# Patient Record
Sex: Male | Born: 1935 | Race: Black or African American | Hispanic: No | Marital: Single | Smoking: Never smoker
Health system: Southern US, Community
[De-identification: ages and names within clinical notes are randomized; demographics above are authoritative.]

## PROBLEM LIST (undated history)

## (undated) DIAGNOSIS — R160 Hepatomegaly, not elsewhere classified: Secondary | ICD-10-CM

## (undated) DIAGNOSIS — N400 Enlarged prostate without lower urinary tract symptoms: Secondary | ICD-10-CM

## (undated) DIAGNOSIS — I10 Essential (primary) hypertension: Secondary | ICD-10-CM

## (undated) DIAGNOSIS — I429 Cardiomyopathy, unspecified: Secondary | ICD-10-CM

## (undated) DIAGNOSIS — F0390 Unspecified dementia without behavioral disturbance: Secondary | ICD-10-CM

---

## 2005-09-15 ENCOUNTER — Emergency Department: Payer: Self-pay | Admitting: Emergency Medicine

## 2010-08-31 ENCOUNTER — Inpatient Hospital Stay: Payer: Self-pay | Admitting: Internal Medicine

## 2010-09-04 LAB — CEA: CEA: 1.3 ng/mL (ref 0.0–4.7)

## 2011-01-08 ENCOUNTER — Inpatient Hospital Stay: Payer: Self-pay | Admitting: Internal Medicine

## 2011-01-13 ENCOUNTER — Emergency Department: Payer: Self-pay | Admitting: Emergency Medicine

## 2011-01-13 LAB — PSA

## 2011-03-10 ENCOUNTER — Emergency Department (HOSPITAL_COMMUNITY)
Admission: EM | Admit: 2011-03-10 | Discharge: 2011-03-10 | Disposition: A | Discharge status: Home / Self Care | Payer: Medicare Other | Attending: Emergency Medicine | Admitting: Emergency Medicine

## 2011-03-10 DIAGNOSIS — R319 Hematuria, unspecified: Secondary | ICD-10-CM | POA: Insufficient documentation

## 2011-03-10 DIAGNOSIS — Z7982 Long term (current) use of aspirin: Secondary | ICD-10-CM | POA: Insufficient documentation

## 2011-03-10 DIAGNOSIS — R609 Edema, unspecified: Secondary | ICD-10-CM | POA: Insufficient documentation

## 2011-03-10 DIAGNOSIS — B351 Tinea unguium: Secondary | ICD-10-CM | POA: Insufficient documentation

## 2011-03-10 DIAGNOSIS — F0390 Unspecified dementia without behavioral disturbance: Secondary | ICD-10-CM | POA: Insufficient documentation

## 2011-03-10 DIAGNOSIS — L80 Vitiligo: Secondary | ICD-10-CM | POA: Insufficient documentation

## 2011-03-10 DIAGNOSIS — E538 Deficiency of other specified B group vitamins: Secondary | ICD-10-CM | POA: Insufficient documentation

## 2011-03-10 DIAGNOSIS — Z79899 Other long term (current) drug therapy: Secondary | ICD-10-CM | POA: Insufficient documentation

## 2011-03-10 DIAGNOSIS — I10 Essential (primary) hypertension: Secondary | ICD-10-CM | POA: Insufficient documentation

## 2011-03-10 LAB — URINALYSIS, ROUTINE W REFLEX MICROSCOPIC
Bilirubin Urine: NEGATIVE
Glucose, UA: NEGATIVE mg/dL
Protein, ur: >300 mg/dL — AB

## 2011-03-10 LAB — URINE MICROSCOPIC-ADD ON

## 2011-03-13 LAB — URINE CULTURE
Colony Count: >=100000
Culture  Setup Time: 201204110531

## 2014-10-27 ENCOUNTER — Encounter (HOSPITAL_COMMUNITY): Payer: Self-pay | Admitting: *Deleted

## 2014-10-27 ENCOUNTER — Inpatient Hospital Stay (HOSPITAL_COMMUNITY)
Admission: EM | Admit: 2014-10-27 | Discharge: 2014-10-31 | DRG: 683 | Disposition: A | Discharge status: Skilled Nursing Facility | Payer: Medicare Other | Attending: Internal Medicine | Admitting: Internal Medicine

## 2014-10-27 DIAGNOSIS — R4182 Altered mental status, unspecified: Secondary | ICD-10-CM

## 2014-10-27 DIAGNOSIS — N179 Acute kidney failure, unspecified: Principal | ICD-10-CM | POA: Diagnosis present

## 2014-10-27 DIAGNOSIS — I10 Essential (primary) hypertension: Secondary | ICD-10-CM | POA: Diagnosis present

## 2014-10-27 DIAGNOSIS — R0902 Hypoxemia: Secondary | ICD-10-CM | POA: Diagnosis present

## 2014-10-27 DIAGNOSIS — I252 Old myocardial infarction: Secondary | ICD-10-CM

## 2014-10-27 DIAGNOSIS — Z79899 Other long term (current) drug therapy: Secondary | ICD-10-CM

## 2014-10-27 DIAGNOSIS — E860 Dehydration: Secondary | ICD-10-CM | POA: Diagnosis present

## 2014-10-27 DIAGNOSIS — N400 Enlarged prostate without lower urinary tract symptoms: Secondary | ICD-10-CM

## 2014-10-27 DIAGNOSIS — F0390 Unspecified dementia without behavioral disturbance: Secondary | ICD-10-CM | POA: Diagnosis present

## 2014-10-27 DIAGNOSIS — I447 Left bundle-branch block, unspecified: Secondary | ICD-10-CM

## 2014-10-27 DIAGNOSIS — D649 Anemia, unspecified: Secondary | ICD-10-CM | POA: Diagnosis present

## 2014-10-27 DIAGNOSIS — E870 Hyperosmolality and hypernatremia: Secondary | ICD-10-CM

## 2014-10-27 DIAGNOSIS — R739 Hyperglycemia, unspecified: Secondary | ICD-10-CM

## 2014-10-27 DIAGNOSIS — Z7982 Long term (current) use of aspirin: Secondary | ICD-10-CM

## 2014-10-27 HISTORY — DX: Cardiomyopathy, unspecified: I42.9

## 2014-10-27 HISTORY — DX: Essential (primary) hypertension: I10

## 2014-10-27 HISTORY — DX: Benign prostatic hyperplasia without lower urinary tract symptoms: N40.0

## 2014-10-27 HISTORY — DX: Hepatomegaly, not elsewhere classified: R16.0

## 2014-10-27 HISTORY — DX: Unspecified dementia, unspecified severity, without behavioral disturbance, psychotic disturbance, mood disturbance, and anxiety: F03.90

## 2014-10-27 NOTE — ED Notes (Signed)
Pt to department via [facility] from [facility].  Per report, staff at facility states pt "just isn't himself".  EMS reports that when they arrived on scene, pt's heart rate was in the 40's and O2 saturations in 80's.  States that they thought pt was in a-fib when placed on cardiac monitor. Upon arrival to department  Pt showing sinus rhythm in 80's, O2 saturations 99-100 % on room air.  No distress noted at this time.

## 2014-10-27 NOTE — ED Provider Notes (Signed)
CSN: [id number]     Arrival date & time 10/27/14  2311 History   First MD Initiated Contact with Patient 10/27/14 2336     Chief Complaint  Patient presents with  . Weakness     (Consider location/radiation/quality/duration/timing/severity/associated sxs/prior Treatment) Patient is a 80 y.o. male presenting with weakness. The history is provided by the nursing home and the EMS personnel. The history is limited by the condition of the patient (Dementia).  Weakness  He was sent here from nursing home because he "wasn't himself". There is no further documentation regarding what was abnormal. EMS reports initial heart rate in the 40s and oxygen saturation in the 80s but both of those were normal on arrival here. Patient is verbal but does not answer questions in any coherent fashion.  Past Medical History  Diagnosis Date  . Hypertension   . Dementia   . Liver mass   . BPH (benign prostatic hyperplasia)   . Cardiomyopathy    History reviewed. No pertinent past surgical history. History reviewed. No pertinent family history. History  Substance Use Topics  . Smoking status: Unknown If Ever Smoked  . Smokeless tobacco: Not on file  . Alcohol Use: Not on file    Review of Systems  Unable to perform ROS: Dementia  Neurological: Positive for weakness.      Allergies  Review of patient's allergies indicates no known allergies.  Home Medications   Prior to Admission medications   Medication Sig Start Date End Date Taking? Authorizing Provider  amLODipine (NORVASC) 10 MG tablet Take 10 mg by mouth daily.   Yes Historical Provider, MD  aspirin 81 MG tablet Take 81 mg by mouth daily.   Yes Historical Provider, MD  ferrous sulfate 325 (65 FE) MG tablet Take 325 mg by mouth daily with breakfast.   Yes Historical Provider, MD  finasteride (PROSCAR) 5 MG tablet Take 5 mg by mouth daily.   Yes Historical Provider, MD  furosemide (LASIX) 40 MG tablet Take 40 mg by mouth 2 (two) times  daily.   Yes Historical Provider, MD  hydrALAZINE (APRESOLINE) 25 MG tablet Take 25 mg by mouth 3 (three) times daily.   Yes Historical Provider, MD   BP 118/62 mmHg  Pulse 87  Temp(Src) 97.7 F (36.5 C) (Oral)  Resp 16  SpO2 99% Physical Exam  Nursing note and vitals reviewed.  80 year old male, resting comfortably and in no acute distress. Vital signs are normal. Oxygen saturation is 99%, which is normal. Head is normocephalic and atraumatic. PERRLA, EOMI. Oropharynx is clear. Neck is nontender and supple without adenopathy or JVD. Back is nontender and there is no CVA tenderness. Lungs are clear without rales, wheezes, or rhonchi. Chest is nontender. Heart has regular rate and rhythm without murmur. Abdomen is soft, flat, nontender without masses or hepatosplenomegaly and peristalsis is normoactive. Extremities have no cyanosis or edema, full range of motion is present. Skin is warm and dry without rash. Neurologic: He is awake and alert but not oriented, cranial nerves are intact, there are no motor or sensory deficits.  ED Course  Procedures (including critical care time) Labs Review Results for orders placed or performed during the hospital encounter of 10/27/14  CBC with Differential  Result Value Ref Range   WBC 8.7 4.0 - 10.5 K/uL   RBC 4.02 (L) 4.22 - 5.81 MIL/uL   Hemoglobin 11.4 (L) 13.0 - 17.0 g/dL   HCT 82.9 (L) 56.2 - 13.0 %   MCV 92.8  78.0 - 100.0 fL   MCH 28.4 26.0 - 34.0 pg   MCHC 30.6 30.0 - 36.0 g/dL   RDW 08.614.0 57.811.5 - 46.915.5 %   Platelets 204 150 - 400 K/uL   Neutrophils Relative % 89 (H) 43 - 77 %   Neutro Abs 7.7 1.7 - 7.7 K/uL   Lymphocytes Relative 6 (L) 12 - 46 %   Lymphs Abs 0.5 (L) 0.7 - 4.0 K/uL   Monocytes Relative 5 3 - 12 %   Monocytes Absolute 0.4 0.1 - 1.0 K/uL   Eosinophils Relative 0 0 - 5 %   Eosinophils Absolute 0.0 0.0 - 0.7 K/uL   Basophils Relative 0 0 - 1 %   Basophils Absolute 0.0 0.0 - 0.1 K/uL  Comprehensive metabolic panel   Result Value Ref Range   Sodium 150 (H) 137 - 147 mEq/L   Potassium 4.6 3.7 - 5.3 mEq/L   Chloride 112 96 - 112 mEq/L   CO2 22 19 - 32 mEq/L   Glucose, Bld 231 (H) 70 - 99 mg/dL   BUN 629102 (H) 6 - 23 mg/dL   Creatinine, Ser 5.284.62 (H) 0.50 - 1.35 mg/dL   Calcium 9.6 8.4 - 41.310.5 mg/dL   Total Protein 8.1 6.0 - 8.3 g/dL   Albumin 3.1 (L) 3.5 - 5.2 g/dL   AST 9 0 - 37 U/L   ALT 10 0 - 53 U/L   Alkaline Phosphatase 87 39 - 117 U/L   Total Bilirubin 0.2 (L) 0.3 - 1.2 mg/dL   GFR calc non Af Amer 10 (L) >90 mL/min   GFR calc Af Amer 12 (L) >90 mL/min   Anion gap 16 (H) 5 - 15  Ammonia  Result Value Ref Range   Ammonia 26 11 - 60 umol/L   Imaging Review Ct Head Wo Contrast  10/28/2014   CLINICAL DATA:  Acute onset of altered mental status and generalized weakness. Initial encounter.  EXAM: CT HEAD WITHOUT CONTRAST  TECHNIQUE: Contiguous axial images were obtained from the base of the skull through the vertex without intravenous contrast.  COMPARISON:  None.  FINDINGS: There is no evidence of acute infarction, mass lesion, or intra- or extra-axial hemorrhage on CT.  Prominence of the ventricles and sulci reflects mild cortical volume loss. Scattered periventricular and subcortical white matter change likely reflects small vessel ischemic microangiopathy. Cerebellar atrophy is noted.  The brainstem and fourth ventricle are within normal limits. The basal ganglia are unremarkable in appearance. The cerebral hemispheres demonstrate grossly normal gray-white differentiation. No mass effect or midline shift is seen.  There is no evidence of fracture; visualized osseous structures are unremarkable in appearance. The visualized portions of the orbits are within normal limits. Chronic mucoperiosteal thickening is noted at the right maxillary sinus. The remaining paranasal sinuses and mastoid air cells are well-aerated. No significant soft tissue abnormalities are seen.  IMPRESSION: 1. No acute intracranial  pathology seen on CT. 2. Mild cortical volume loss and scattered small vessel ischemic microangiopathy. 3. Chronic mucoperiosteal thickening at the right maxillary sinus.   Electronically Signed   By: Roanna RaiderJeffery  Chang M.D.   On: 10/28/2014 01:03   Dg Chest Port 1 View  10/28/2014   CLINICAL DATA:  80 year old male with altered mental status and generalized weakness  EXAM: PORTABLE CHEST - 1 VIEW  COMPARISON:  CT scan of the head performed earlier today  FINDINGS: Mild cardiomegaly. Inspiratory volumes are low and there are bibasilar opacities. Normal mediastinal contours. No pneumothorax.  No acute osseous abnormality.  IMPRESSION: 1. Low inspiratory volumes with bibasilar opacities favored to reflect atelectasis. Superimposed infiltrate would be difficult to exclude radiographically. 2. Mild cardiomegaly.   Electronically Signed   By: Malachy MoanHeath  McCullough M.D.   On: 10/28/2014 01:24     EKG Interpretation   Date/Time:  Friday October 27 2014 23:24:21 EST Ventricular Rate:  86 PR Interval:  242 QRS Duration: 160 QT Interval:  424 QTC Calculation: 507 R Axis:   6 Text Interpretation:  Sinus rhythm Prolonged PR interval Left bundle  branch block No old tracing to compare Confirmed by [facility] IncGLICK  MD, Oneal Biglow  ([id number]) on 10/27/2014 11:36:22 PM      MDM   Final diagnoses:  Altered mental status  Acute renal failure, unspecified acute renal failure type  Hypernatremia  Normochromic normocytic anemia  Hyperglycemia    Altered mentation with report of episode of bradycardia and hypoxia. Nothing on physical exam to point me in any specific direction currently. General screening workup will be obtained for altered mental status and weakness with focus on evaluation for occult infection.  Workup is significant for hypernatremia and markedly elevated BUN and creatinine. BUN: Creatinine ratio is elevated suggesting dehydration. He will be given IV fluids and will need to be admitted to monitor renal  function. Case is discussed with Dr. Konrad DoloresMerrell of [facility] who agrees to admit the patient.  Dione Boozeavid Riordan Walle, MD 10/28/14 [phone]

## 2014-10-28 ENCOUNTER — Encounter (HOSPITAL_COMMUNITY): Payer: Self-pay | Admitting: General Practice

## 2014-10-28 ENCOUNTER — Emergency Department (HOSPITAL_COMMUNITY): Payer: Medicare Other

## 2014-10-28 DIAGNOSIS — F0390 Unspecified dementia without behavioral disturbance: Secondary | ICD-10-CM

## 2014-10-28 DIAGNOSIS — E870 Hyperosmolality and hypernatremia: Secondary | ICD-10-CM | POA: Diagnosis present

## 2014-10-28 DIAGNOSIS — Z79899 Other long term (current) drug therapy: Secondary | ICD-10-CM | POA: Diagnosis not present

## 2014-10-28 DIAGNOSIS — R0902 Hypoxemia: Secondary | ICD-10-CM | POA: Insufficient documentation

## 2014-10-28 DIAGNOSIS — I252 Old myocardial infarction: Secondary | ICD-10-CM | POA: Diagnosis not present

## 2014-10-28 DIAGNOSIS — Z7982 Long term (current) use of aspirin: Secondary | ICD-10-CM | POA: Diagnosis not present

## 2014-10-28 DIAGNOSIS — E860 Dehydration: Secondary | ICD-10-CM | POA: Diagnosis present

## 2014-10-28 DIAGNOSIS — N179 Acute kidney failure, unspecified: Secondary | ICD-10-CM | POA: Insufficient documentation

## 2014-10-28 DIAGNOSIS — I447 Left bundle-branch block, unspecified: Secondary | ICD-10-CM | POA: Diagnosis not present

## 2014-10-28 DIAGNOSIS — R4182 Altered mental status, unspecified: Secondary | ICD-10-CM | POA: Diagnosis present

## 2014-10-28 DIAGNOSIS — D649 Anemia, unspecified: Secondary | ICD-10-CM | POA: Diagnosis not present

## 2014-10-28 DIAGNOSIS — R001 Bradycardia, unspecified: Secondary | ICD-10-CM | POA: Insufficient documentation

## 2014-10-28 DIAGNOSIS — I10 Essential (primary) hypertension: Secondary | ICD-10-CM

## 2014-10-28 DIAGNOSIS — N400 Enlarged prostate without lower urinary tract symptoms: Secondary | ICD-10-CM

## 2014-10-28 LAB — CBC
HCT: 35.3 % — ABNORMAL LOW (ref 39.0–52.0)
Hemoglobin: 11 g/dL — ABNORMAL LOW (ref 13.0–17.0)
MCH: 28.7 pg (ref 26.0–34.0)
MCHC: 31.2 g/dL (ref 30.0–36.0)
MCV: 92.2 fL (ref 78.0–100.0)
PLATELETS: 216 10*3/uL (ref 150–400)
RBC: 3.83 MIL/uL — ABNORMAL LOW (ref 4.22–5.81)
RDW: 13.9 % (ref 11.5–15.5)
WBC: 10.8 10*3/uL — AB (ref 4.0–10.5)

## 2014-10-28 LAB — COMPREHENSIVE METABOLIC PANEL
ALK PHOS: 87 U/L (ref 39–117)
ALT: 10 U/L (ref 0–53)
ANION GAP: 16 — AB (ref 5–15)
AST: 9 U/L (ref 0–37)
Albumin: 3.1 g/dL — ABNORMAL LOW (ref 3.5–5.2)
BILIRUBIN TOTAL: 0.2 mg/dL — AB (ref 0.3–1.2)
BUN: 102 mg/dL — AB (ref 6–23)
CHLORIDE: 112 meq/L (ref 96–112)
CO2: 22 meq/L (ref 19–32)
Calcium: 9.6 mg/dL (ref 8.4–10.5)
Creatinine, Ser: 4.62 mg/dL — ABNORMAL HIGH (ref 0.50–1.35)
GFR calc non Af Amer: 10 mL/min — ABNORMAL LOW (ref >90)
GFR, EST AFRICAN AMERICAN: 12 mL/min — AB (ref >90)
GLUCOSE: 231 mg/dL — AB (ref 70–99)
POTASSIUM: 4.6 meq/L (ref 3.7–5.3)
SODIUM: 150 meq/L — AB (ref 137–147)
TOTAL PROTEIN: 8.1 g/dL (ref 6.0–8.3)

## 2014-10-28 LAB — MRSA PCR SCREENING: MRSA by PCR: NEGATIVE

## 2014-10-28 LAB — CBC WITH DIFFERENTIAL/PLATELET
Basophils Absolute: 0 10*3/uL (ref 0.0–0.1)
Basophils Relative: 0 % (ref 0–1)
Eosinophils Absolute: 0 10*3/uL (ref 0.0–0.7)
Eosinophils Relative: 0 % (ref 0–5)
HCT: 37.3 % — ABNORMAL LOW (ref 39.0–52.0)
HEMOGLOBIN: 11.4 g/dL — AB (ref 13.0–17.0)
LYMPHS ABS: 0.5 10*3/uL — AB (ref 0.7–4.0)
LYMPHS PCT: 6 % — AB (ref 12–46)
MCH: 28.4 pg (ref 26.0–34.0)
MCHC: 30.6 g/dL (ref 30.0–36.0)
MCV: 92.8 fL (ref 78.0–100.0)
MONOS PCT: 5 % (ref 3–12)
Monocytes Absolute: 0.4 10*3/uL (ref 0.1–1.0)
NEUTROS ABS: 7.7 10*3/uL (ref 1.7–7.7)
NEUTROS PCT: 89 % — AB (ref 43–77)
PLATELETS: 204 10*3/uL (ref 150–400)
RBC: 4.02 MIL/uL — AB (ref 4.22–5.81)
RDW: 14 % (ref 11.5–15.5)
WBC: 8.7 10*3/uL (ref 4.0–10.5)

## 2014-10-28 LAB — RAPID URINE DRUG SCREEN, HOSP PERFORMED
Amphetamines: NOT DETECTED
Barbiturates: NOT DETECTED
Benzodiazepines: NOT DETECTED
Cocaine: NOT DETECTED
Opiates: NOT DETECTED
Tetrahydrocannabinol: NOT DETECTED

## 2014-10-28 LAB — ETHANOL: Alcohol, Ethyl (B): <11 mg/dL (ref 0–11)

## 2014-10-28 LAB — BASIC METABOLIC PANEL
ANION GAP: 13 (ref 5–15)
ANION GAP: 16 — AB (ref 5–15)
BUN: 99 mg/dL — ABNORMAL HIGH (ref 6–23)
BUN: 90 mg/dL — ABNORMAL HIGH (ref 6–23)
CALCIUM: 9.3 mg/dL (ref 8.4–10.5)
CHLORIDE: 120 meq/L — AB (ref 96–112)
CHLORIDE: 112 meq/L (ref 96–112)
CO2: 22 mEq/L (ref 19–32)
CO2: 21 mEq/L (ref 19–32)
CREATININE: 3.55 mg/dL — AB (ref 0.50–1.35)
Calcium: 9.1 mg/dL (ref 8.4–10.5)
Creatinine, Ser: 4.22 mg/dL — ABNORMAL HIGH (ref 0.50–1.35)
GFR calc Af Amer: 17 mL/min — ABNORMAL LOW (ref >90)
GFR calc non Af Amer: 12 mL/min — ABNORMAL LOW (ref >90)
GFR calc non Af Amer: 14 mL/min — ABNORMAL LOW (ref >90)
GFR, EST AFRICAN AMERICAN: 13 mL/min — AB (ref >90)
GLUCOSE: 157 mg/dL — AB (ref 70–99)
Glucose, Bld: 115 mg/dL — ABNORMAL HIGH (ref 70–99)
POTASSIUM: 4.5 meq/L (ref 3.7–5.3)
Potassium: 4.3 mEq/L (ref 3.7–5.3)
Sodium: 155 mEq/L — ABNORMAL HIGH (ref 137–147)
Sodium: 149 mEq/L — ABNORMAL HIGH (ref 137–147)

## 2014-10-28 LAB — URINALYSIS, ROUTINE W REFLEX MICROSCOPIC
BILIRUBIN URINE: NEGATIVE
Glucose, UA: NEGATIVE mg/dL
HGB URINE DIPSTICK: NEGATIVE
KETONES UR: NEGATIVE mg/dL
Leukocytes, UA: NEGATIVE
NITRITE: NEGATIVE
Protein, ur: NEGATIVE mg/dL
SPECIFIC GRAVITY, URINE: 1.015 (ref 1.005–1.030)
UROBILINOGEN UA: 0.2 mg/dL (ref 0.0–1.0)
pH: 5.5 (ref 5.0–8.0)

## 2014-10-28 LAB — AMMONIA: Ammonia: 26 umol/L (ref 11–60)

## 2014-10-28 LAB — HEPARIN LEVEL (UNFRACTIONATED): Heparin Unfractionated: 0.55 IU/mL (ref 0.30–0.70)

## 2014-10-28 LAB — TROPONIN I
Troponin I: <0.3 ng/mL (ref <0.30)
Troponin I: <0.3 ng/mL (ref <0.30)

## 2014-10-28 LAB — PROTIME-INR
INR: 1.31 (ref 0.00–1.49)
Prothrombin Time: 16.4 seconds — ABNORMAL HIGH (ref 11.6–15.2)

## 2014-10-28 MED ORDER — ONDANSETRON HCL 4 MG/2ML IJ SOLN: 4.0000 mg | Freq: Four times a day (QID) | INTRAMUSCULAR | Status: DC | PRN

## 2014-10-28 MED ORDER — ACETAMINOPHEN 650 MG RE SUPP: 650.0000 mg | Freq: Four times a day (QID) | RECTAL | Status: DC | PRN

## 2014-10-28 MED ORDER — HEPARIN BOLUS VIA INFUSION
4000.0000 [IU] | Freq: Once | INTRAVENOUS | Status: AC
  Administered 2014-10-28: 4000 [IU] via INTRAVENOUS
  Filled 2014-10-28: qty 4000

## 2014-10-28 MED ORDER — ACETAMINOPHEN 325 MG PO TABS: 650.0000 mg | ORAL_TABLET | Freq: Four times a day (QID) | ORAL | Status: DC | PRN

## 2014-10-28 MED ORDER — HEPARIN (PORCINE) IN NACL 100-0.45 UNIT/ML-% IJ SOLN
1000.0000 [IU]/h | INTRAMUSCULAR | Status: DC
  Administered 2014-10-28 (×2): 1000 [IU]/h via INTRAVENOUS
  Filled 2014-10-28 (×2): qty 250

## 2014-10-28 MED ORDER — INFLUENZA VAC SPLIT QUAD 0.5 ML IM SUSY
0.5000 mL | PREFILLED_SYRINGE | INTRAMUSCULAR | Status: AC
  Administered 2014-10-29: 0.5 mL via INTRAMUSCULAR
  Filled 2014-10-28: qty 0.5

## 2014-10-28 MED ORDER — ONDANSETRON HCL 4 MG PO TABS: 4.0000 mg | ORAL_TABLET | Freq: Four times a day (QID) | ORAL | Status: DC | PRN

## 2014-10-28 MED ORDER — SODIUM CHLORIDE 0.9 % IV SOLN: INTRAVENOUS | Status: DC

## 2014-10-28 MED ORDER — ASPIRIN 81 MG PO CHEW
81.0000 mg | CHEWABLE_TABLET | Freq: Every day | ORAL | Status: DC
  Administered 2014-10-28 – 2014-10-31 (×4): 81 mg via ORAL
  Filled 2014-10-28 (×4): qty 1

## 2014-10-28 MED ORDER — SODIUM CHLORIDE 0.9 % IV SOLN: 1000.0000 mL | INTRAVENOUS | Status: DC

## 2014-10-28 MED ORDER — HYDRALAZINE HCL 25 MG PO TABS
25.0000 mg | ORAL_TABLET | Freq: Three times a day (TID) | ORAL | Status: DC
  Administered 2014-10-28 – 2014-10-31 (×11): 25 mg via ORAL
  Filled 2014-10-28 (×11): qty 1

## 2014-10-28 MED ORDER — PNEUMOCOCCAL VAC POLYVALENT 25 MCG/0.5ML IJ INJ
0.5000 mL | INJECTION | INTRAMUSCULAR | Status: AC
  Administered 2014-10-29: 0.5 mL via INTRAMUSCULAR
  Filled 2014-10-28: qty 0.5

## 2014-10-28 MED ORDER — OXYCODONE HCL 5 MG PO TABS: 5.0000 mg | ORAL_TABLET | ORAL | Status: DC | PRN

## 2014-10-28 MED ORDER — SODIUM CHLORIDE 0.9 % IV SOLN
1000.0000 mL | Freq: Once | INTRAVENOUS | Status: DC
Start: 2014-10-28 — End: 2014-10-31

## 2014-10-28 MED ORDER — HEPARIN SODIUM (PORCINE) 5000 UNIT/ML IJ SOLN: 5000.0000 [IU] | Freq: Three times a day (TID) | INTRAMUSCULAR | Status: DC

## 2014-10-28 MED ORDER — FUROSEMIDE 40 MG PO TABS
40.0000 mg | ORAL_TABLET | Freq: Two times a day (BID) | ORAL | Status: DC
  Administered 2014-10-28: 40 mg via ORAL
  Filled 2014-10-28: qty 1

## 2014-10-28 MED ORDER — SODIUM CHLORIDE 0.45 % IV SOLN
INTRAVENOUS | Status: DC
  Administered 2014-10-28 – 2014-10-31 (×8): via INTRAVENOUS

## 2014-10-28 MED ORDER — AMLODIPINE BESYLATE 5 MG PO TABS
10.0000 mg | ORAL_TABLET | Freq: Every day | ORAL | Status: DC
  Administered 2014-10-28 – 2014-10-31 (×4): 10 mg via ORAL
  Filled 2014-10-28 (×4): qty 2

## 2014-10-28 MED ORDER — FINASTERIDE 5 MG PO TABS
5.0000 mg | ORAL_TABLET | Freq: Every day | ORAL | Status: DC
  Administered 2014-10-28 – 2014-10-31 (×4): 5 mg via ORAL
  Filled 2014-10-28 (×5): qty 1

## 2014-10-28 NOTE — Progress Notes (Signed)
[facility] PROGRESS NOTE  Daniel LinkWilliam Francois MVH:[id number] DOB: May 12, 1926 DOA: 10/27/2014 PCP: No primary care provider on file.  Assessment/Plan: 1. LBBB 1. Unknown if acute or chronic 2. Pt without obvious chest pains, no diaphoresis or nausea 3. Pt does have hx of prior MI, so this finding may be chronic. No old EKG to compare to. 4. Will check 2d echo for WMA 5. Repeat ekg in AM for changes 6. Complete serial enzymes 7. Consider holding further heparin gtt 2. ARF 1. Cont IVF hydration 2. Hold lasix 3. Renal function slightly improved 4. Pt reportedly voiding briskly - unable to measure secondary to incontinence 3. Hypernatremia 1. Rising this AM 2. Hold lasix 3. Suspect secondary to dehydration 4. HTN 1. BP stable  2. Cont current regimen 5. BPH 1. Cont proscar 6. DVT prophylaxis 1. Cont on heparin subq  Code Status: Full Family Communication: Pt in room (indicate person spoken with, relationship, and if by phone, the number) Disposition Plan: Pending   Consultants:    Procedures:    Antibiotics:   (indicate start date, and stop date if known)  HPI/Subjective: No acute events noted. Pt remains confused  Objective: Filed Vitals:   10/28/14 0644 10/28/14 0844 10/28/14 1134 10/28/14 1555  BP: 144/52 128/55 139/72 139/67  Pulse: 82 86 95 92  Temp: 98.2 F (36.8 C)   98.5 F (36.9 C)  TempSrc: Oral   Oral  Resp: 18   20  Height:      Weight:      SpO2: 100% 99% 100% 100%    Intake/Output Summary (Last 24 hours) at 10/28/14 1643 Last data filed at 10/28/14 1514  Gross per 24 hour  Intake  617.5 ml  Output    500 ml  Net  117.5 ml   Filed Weights   10/28/14 0256  Weight: 88.27 kg (194 lb 9.6 oz)    Exam:   General:  Awake, in nad  Cardiovascular: regular, s1, s2  Respiratory: normal resp effort, no wheezing  Abdomen: soft,nondistended  Musculoskeletal: perfused, no clubbing   Data Reviewed: Basic Metabolic Panel:  Recent  Labs Lab 10/28/14 0014 10/28/14 0623  NA 150* 155*  K 4.6 4.5  CL 112 120*  CO2 22 22  GLUCOSE 231* 115*  BUN 102* 99*  CREATININE 4.62* 4.22*  CALCIUM 9.6 9.1   Liver Function Tests:  Recent Labs Lab 10/28/14 0014  AST 9  ALT 10  ALKPHOS 87  BILITOT 0.2*  PROT 8.1  ALBUMIN 3.1*   No results for input(s): LIPASE, AMYLASE in the last 168 hours.  Recent Labs Lab 10/28/14 0014  AMMONIA 26   CBC:  Recent Labs Lab 10/28/14 0014 10/28/14 0621  WBC 8.7 10.8*  NEUTROABS 7.7  --   HGB 11.4* 11.0*  HCT 37.3* 35.3*  MCV 92.8 92.2  PLT 204 216   Cardiac Enzymes:  Recent Labs Lab 10/28/14 1112 10/28/14 1310  TROPONINI <0.30 <0.30   BNP (last 3 results) No results for input(s): PROBNP in the last 8760 hours. CBG: No results for input(s): GLUCAP in the last 168 hours.  Recent Results (from the past 240 hour(s))  MRSA PCR Screening     Status: None   Collection Time: 10/28/14  3:40 AM  Result Value Ref Range Status   MRSA by PCR NEGATIVE NEGATIVE Final    Comment:        The GeneXpert MRSA Assay (FDA approved for NASAL specimens only), is one component of a comprehensive MRSA  colonization surveillance program. It is not intended to diagnose MRSA infection nor to guide or monitor treatment for MRSA infections.      Studies: Ct Head Wo Contrast  10/28/2014   CLINICAL DATA:  Acute onset of altered mental status and generalized weakness. Initial encounter.  EXAM: CT HEAD WITHOUT CONTRAST  TECHNIQUE: Contiguous axial images were obtained from the base of the skull through the vertex without intravenous contrast.  COMPARISON:  None.  FINDINGS: There is no evidence of acute infarction, mass lesion, or intra- or extra-axial hemorrhage on CT.  Prominence of the ventricles and sulci reflects mild cortical volume loss. Scattered periventricular and subcortical white matter change likely reflects small vessel ischemic microangiopathy. Cerebellar atrophy is noted.   The brainstem and fourth ventricle are within normal limits. The basal ganglia are unremarkable in appearance. The cerebral hemispheres demonstrate grossly normal gray-white differentiation. No mass effect or midline shift is seen.  There is no evidence of fracture; visualized osseous structures are unremarkable in appearance. The visualized portions of the orbits are within normal limits. Chronic mucoperiosteal thickening is noted at the right maxillary sinus. The remaining paranasal sinuses and mastoid air cells are well-aerated. No significant soft tissue abnormalities are seen.  IMPRESSION: 1. No acute intracranial pathology seen on CT. 2. Mild cortical volume loss and scattered small vessel ischemic microangiopathy. 3. Chronic mucoperiosteal thickening at the right maxillary sinus.   Electronically Signed   By: Roanna RaiderJeffery  Chang M.D.   On: 10/28/2014 01:03   Dg Chest Port 1 View  10/28/2014   CLINICAL DATA:  80 year old male with altered mental status and generalized weakness  EXAM: PORTABLE CHEST - 1 VIEW  COMPARISON:  CT scan of the head performed earlier today  FINDINGS: Mild cardiomegaly. Inspiratory volumes are low and there are bibasilar opacities. Normal mediastinal contours. No pneumothorax. No acute osseous abnormality.  IMPRESSION: 1. Low inspiratory volumes with bibasilar opacities favored to reflect atelectasis. Superimposed infiltrate would be difficult to exclude radiographically. 2. Mild cardiomegaly.   Electronically Signed   By: Malachy MoanHeath  McCullough M.D.   On: 10/28/2014 01:24    Scheduled Meds: . sodium chloride  1,000 mL Intravenous Once  . amLODipine  10 mg Oral Daily  . aspirin  81 mg Oral Daily  . finasteride  5 mg Oral Daily  . hydrALAZINE  25 mg Oral TID  . [START ON 10/29/2014] Influenza vac split quadrivalent PF  0.5 mL Intramuscular Tomorrow-1000  . [START ON 10/29/2014] pneumococcal 23 valent vaccine  0.5 mL Intramuscular Tomorrow-1000   Continuous Infusions: . sodium  chloride 100 mL/hr at 10/28/14 1341  . heparin 1,000 Units/hr (10/28/14 0455)    Principal Problem:   Altered mental status Active Problems:   Acute kidney injury   Dementia   Hypernatremia   BPH (benign prostatic hyperplasia)   Essential hypertension   History of myocardial infarction   Left bundle branch block   Normochromic normocytic anemia    Time spent: 30min    Hutch Rhett K  [facility] Pager [phone]. If 7PM-7AM, please contact night-coverage at [email or web address], password [email or web address] 10/28/2014, 4:43 PM  LOS: 1 day

## 2014-10-28 NOTE — Progress Notes (Signed)
ANTICOAGULATION CONSULT NOTE -  Pharmacy Consult for Heparin Indication: chest pain/ACS  No Known Allergies  Patient Measurements: Height: 5\' 11"  (180.3 cm) Weight: 194 lb 9.6 oz (88.27 kg) IBW/kg (Calculated) : 75.3 Heparin Dosing Weight: 82 kg  Vital Signs: Temp: 98.2 F (36.8 C) (11/28 0644) Temp Source: Oral (11/28 0644) BP: 139/72 mmHg (11/28 1134) Pulse Rate: 95 (11/28 1134)  Labs:  Recent Labs  10/28/14 0014 10/28/14 0621 10/28/14 0623 10/28/14 1112 10/28/14 1310  HGB 11.4* 11.0*  --   --   --   HCT 37.3* 35.3*  --   --   --   PLT 204 216  --   --   --   LABPROT  --  16.4*  --   --   --   INR  --  1.31  --   --   --   HEPARINUNFRC  --   --   --   --  0.55  CREATININE 4.62*  --  4.22*  --   --   TROPONINI  --   --   --  <0.30 <0.30    Estimated Creatinine Clearance: 13.4 mL/min (by C-G formula based on Cr of 4.22).   Medical History: Past Medical History  Diagnosis Date  . Hypertension   . Dementia   . Liver mass   . BPH (benign prostatic hyperplasia)   . Cardiomyopathy     Medications:  Scheduled:  . sodium chloride  1,000 mL Intravenous Once  . amLODipine  10 mg Oral Daily  . aspirin  81 mg Oral Daily  . finasteride  5 mg Oral Daily  . furosemide  40 mg Oral BID  . hydrALAZINE  25 mg Oral TID  . [START ON 10/29/2014] Influenza vac split quadrivalent PF  0.5 mL Intramuscular Tomorrow-1000  . [START ON 10/29/2014] pneumococcal 23 valent vaccine  0.5 mL Intramuscular Tomorrow-1000    Assessment: Left bundle block, history of MI Labs reviewed PTA medication reviewed Heparin level therapeutic  Goal of Therapy:  Heparin level 0.3-0.7 units/ml Monitor platelets by anticoagulation protocol: Yes   Plan:  Continue Heparin 1000 units/hr rate Heparin level and CBC daily   Daniel Holt 10/28/2014,1:45 PM

## 2014-10-28 NOTE — H&P (Signed)
[facility] History and Physical  Daniel LinkWilliam Holt ZOX:[id number] DOB: July 08, 1926 DOA: 10/27/2014  Referring physician: Dr Preston FleetingGlick - [facility] PCP: No primary care provider on file.   Chief Complaint: AMS  HPI: Daniel Holt is a 80 y.o. male  Pt altered and no family present for admission. Report given by Dr. Preston FleetingGlick in the ED. Pts caretaker at the nursing home not available for consultation at time of admission  Per report, pt presenting from NH as not being "himself." Started today. Per EMS, initial HR 40s and O2 sats 80s.    Review of Systems:  Per HPI   Past Medical History  Diagnosis Date  . Hypertension   . Dementia   . Liver mass   . BPH (benign prostatic hyperplasia)   . Cardiomyopathy    History reviewed. No pertinent past surgical history. Social History:  has no tobacco, alcohol, and drug history on file.  No Known Allergies  History reviewed. No pertinent family history.   Prior to Admission medications   Medication Sig Start Date End Date Taking? Authorizing Provider  amLODipine (NORVASC) 10 MG tablet Take 10 mg by mouth daily.   Yes Historical Provider, MD  aspirin 81 MG tablet Take 81 mg by mouth daily.   Yes Historical Provider, MD  ferrous sulfate 325 (65 FE) MG tablet Take 325 mg by mouth daily with breakfast.   Yes Historical Provider, MD  finasteride (PROSCAR) 5 MG tablet Take 5 mg by mouth daily.   Yes Historical Provider, MD  furosemide (LASIX) 40 MG tablet Take 40 mg by mouth 2 (two) times daily.   Yes Historical Provider, MD  hydrALAZINE (APRESOLINE) 25 MG tablet Take 25 mg by mouth 3 (three) times daily.   Yes Historical Provider, MD   Physical Exam: Filed Vitals:   10/27/14 2320 10/27/14 2330 10/28/14 0211 10/28/14 0256  BP: 118/62 107/55 141/65 114/64  Pulse: 87 84 74 90  Temp: 97.7 F (36.5 C)  98.2 F (36.8 C) 98.6 F (37 C)  TempSrc: Oral  Oral Oral  Resp: 16 17 18 18   Weight:    88.27 kg (194 lb 9.6 oz)  SpO2: 99% 92% 98% 98%    Wt  Readings from Last 3 Encounters:  10/28/14 88.27 kg (194 lb 9.6 oz)    General:  Appears calm and comfortable Eyes: EOMI, PERRL, normal lids, irises & conjunctiva ENT: Dry mucus membranes Neck:  no LAD, masses or thyromegaly Cardiovascular:  RRR, II/VI systolic murmur, trace LE edema. Telemetry: SR, no arrhythmias  Respiratory:  CTA bilaterally, no w/r/r. Normal respiratory effort. Abdomen:  soft, ntnd Skin:  no rash or induration seen on limited exam Musculoskeletal:  grossly normal tone BUE/BLE Psychiatric: non-communicative, follows commands.  Neurologic:  No focal deficits. Moves all extremities purposefully          Labs on Admission:  Basic Metabolic Panel:  Recent Labs Lab 10/28/14 0014  NA 150*  K 4.6  CL 112  CO2 22  GLUCOSE 231*  BUN 102*  CREATININE 4.62*  CALCIUM 9.6   Liver Function Tests:  Recent Labs Lab 10/28/14 0014  AST 9  ALT 10  ALKPHOS 87  BILITOT 0.2*  PROT 8.1  ALBUMIN 3.1*   No results for input(s): LIPASE, AMYLASE in the last 168 hours.  Recent Labs Lab 10/28/14 0014  AMMONIA 26   CBC:  Recent Labs Lab 10/28/14 0014  WBC 8.7  NEUTROABS 7.7  HGB 11.4*  HCT 37.3*  MCV 92.8  PLT 204  Cardiac Enzymes: No results for input(s): CKTOTAL, CKMB, CKMBINDEX, TROPONINI in the last 168 hours.  BNP (last 3 results) No results for input(s): PROBNP in the last 8760 hours. CBG: No results for input(s): GLUCAP in the last 168 hours.  Radiological Exams on Admission: Ct Head Wo Contrast  10/28/2014   CLINICAL DATA:  Acute onset of altered mental status and generalized weakness. Initial encounter.  EXAM: CT HEAD WITHOUT CONTRAST  TECHNIQUE: Contiguous axial images were obtained from the base of the skull through the vertex without intravenous contrast.  COMPARISON:  None.  FINDINGS: There is no evidence of acute infarction, mass lesion, or intra- or extra-axial hemorrhage on CT.  Prominence of the ventricles and sulci reflects mild  cortical volume loss. Scattered periventricular and subcortical white matter change likely reflects small vessel ischemic microangiopathy. Cerebellar atrophy is noted.  The brainstem and fourth ventricle are within normal limits. The basal ganglia are unremarkable in appearance. The cerebral hemispheres demonstrate grossly normal gray-white differentiation. No mass effect or midline shift is seen.  There is no evidence of fracture; visualized osseous structures are unremarkable in appearance. The visualized portions of the orbits are within normal limits. Chronic mucoperiosteal thickening is noted at the right maxillary sinus. The remaining paranasal sinuses and mastoid air cells are well-aerated. No significant soft tissue abnormalities are seen.  IMPRESSION: 1. No acute intracranial pathology seen on CT. 2. Mild cortical volume loss and scattered small vessel ischemic microangiopathy. 3. Chronic mucoperiosteal thickening at the right maxillary sinus.   Electronically Signed   By: Jeffery  Chang M.D.   On: 10/28/2014 01:03   Dg Chest Port 1 View  10/28/2014   CLINICAL DATA:  80 year old male with altered mental status and generalized weakness  EXAM: PORTABLE CHEST - 1 VIEW  COMPARISON:  CT scan of the head performed earlier today  FINDINGS: Mild cardiomegaly. Inspiratory volumes are low and there are bibasilar opacities. Normal mediastinal contours. No pneumothorax. No acute osseous abnormality.  IMPRESSION: 1. Low inspiratory volumes with bibasilar opacities favored to reflect atelectasis. Superimposed infiltrate would be difficult to exclude radiographically. 2. Mild cardiomegaly.   Electronically Signed   By: Heath  McCullough M.D.   On: 10/28/2014 01:24    EKG: Independently reviewed. NSR  Assessment/Plan Principal Problem:   Altered mental status Active Problems:   Acute kidney injury   Dementia   Hypernatremia   BPH (benign prostatic hyperplasia)   Essential hypertension   History of  myocardial infarction   Left bundle branch block  Altered Mental status: likely secondary to baseline dementia compounded by uremia from ARF. CT head w/o acute process and no sign of infection, seizure or medication induced symptoms. Pt is participatory and will follow commands but is non-communicative. Ammonia nml. - Admit - tele - IVF 1/2NS 100ml/hr - NPO until passes bedside swallow - UDS, ETOH  Acute kidney Injury: no previous dx of renal disease. Likely secondary to hypoperfusion from dehydration given hypernatremia, and BUN:Cr ratio. Pt given 1LNS bolus in ED - hydration at 100ml/hr of 1/2NS given hypernatremia - BMEt in am - +/- renal consult - Hold home lasix until improving.  Left bundle branch block: H/o MI. Called pts NH ([facility]), no EKG on file. Likely from old infarction but due to no previous history will workup.  - cycle troponins (call cardiology if +) - Heparin - Tele - EKG in the am  Hypernatremia: Na 150 no admission. No previous values. Likely from dehydration - IVF <MEASUREM<MEASUREMEN T> 1/2NS  -  BMET in the am  HTN: - continue hom ehydralazine,   BPH:  - continue home proscar  Social: Code status not clear.  - contact family in the am. POA Maclovio Henson at [phone]  Code Status: FULL DVT Prophylaxis: Full dose Heparin Family Communication: called the pts nursing home Disposition Plan: pending improvement  Time spent: >70 min in direct pt care and coordination  MERRELL, DAVID J [facility] [email or web address] Password [email or web address]

## 2014-10-28 NOTE — Progress Notes (Signed)
ANTICOAGULATION CONSULT NOTE - Initial Consult  Pharmacy Consult for Heparin Indication: chest pain/ACS  No Known Allergies  Patient Measurements: Weight: 194 lb 9.6 oz (88.27 kg) Heparin Dosing Weight: 82 kg  Vital Signs: Temp: 98.6 F (37 C) (11/28 0256) Temp Source: Oral (11/28 0256) BP: 114/64 mmHg (11/28 0256) Pulse Rate: 90 (11/28 0256)  Labs:  Recent Labs  10/28/14 0014  HGB 11.4*  HCT 37.3*  PLT 204  CREATININE 4.62*    CrCl cannot be calculated (Unknown ideal weight.).   Medical History: Past Medical History  Diagnosis Date  . Hypertension   . Dementia   . Liver mass   . BPH (benign prostatic hyperplasia)   . Cardiomyopathy     Medications:  Scheduled:  . sodium chloride  1,000 mL Intravenous Once  . amLODipine  10 mg Oral Daily  . aspirin  81 mg Oral Daily  . finasteride  5 mg Oral Daily  . furosemide  40 mg Oral BID  . heparin  4,000 Units Intravenous Once  . hydrALAZINE  25 mg Oral TID    Assessment: Left bundle block, history of MI Labs reviewed PTA medication reviewed  Goal of Therapy:  Heparin level 0.3-0.7 units/ml Monitor platelets by anticoagulation protocol: Yes   Plan:  Give 4000 units bolus x 1 Start heparin infusion at 1000 units/hr Check anti-Xa level in 8 hours and daily while on heparin Continue to monitor H&H and platelets  Raquel JamesPittman, Sumiko Ceasar Bennett 10/28/2014,4:20 AM

## 2014-10-29 DIAGNOSIS — N179 Acute kidney failure, unspecified: Secondary | ICD-10-CM | POA: Diagnosis not present

## 2014-10-29 DIAGNOSIS — I517 Cardiomegaly: Secondary | ICD-10-CM

## 2014-10-29 LAB — CBC
HCT: 34.7 % — ABNORMAL LOW (ref 39.0–52.0)
HEMOGLOBIN: 10.9 g/dL — AB (ref 13.0–17.0)
MCH: 28.8 pg (ref 26.0–34.0)
MCHC: 31.4 g/dL (ref 30.0–36.0)
MCV: 91.6 fL (ref 78.0–100.0)
Platelets: 206 10*3/uL (ref 150–400)
RBC: 3.79 MIL/uL — AB (ref 4.22–5.81)
RDW: 13.8 % (ref 11.5–15.5)
WBC: 8.1 10*3/uL (ref 4.0–10.5)

## 2014-10-29 LAB — BASIC METABOLIC PANEL
Anion gap: 12 (ref 5–15)
BUN: 76 mg/dL — AB (ref 6–23)
CALCIUM: 8.9 mg/dL (ref 8.4–10.5)
CO2: 22 meq/L (ref 19–32)
Chloride: 114 mEq/L — ABNORMAL HIGH (ref 96–112)
Creatinine, Ser: 2.94 mg/dL — ABNORMAL HIGH (ref 0.50–1.35)
GFR calc Af Amer: 21 mL/min — ABNORMAL LOW (ref >90)
GFR calc non Af Amer: 18 mL/min — ABNORMAL LOW (ref >90)
GLUCOSE: 122 mg/dL — AB (ref 70–99)
Potassium: 4.2 mEq/L (ref 3.7–5.3)
SODIUM: 148 meq/L — AB (ref 137–147)

## 2014-10-29 LAB — HEPARIN LEVEL (UNFRACTIONATED): Heparin Unfractionated: 0.49 IU/mL (ref 0.30–0.70)

## 2014-10-29 MED ORDER — HEPARIN SODIUM (PORCINE) 5000 UNIT/ML IJ SOLN
5000.0000 [IU] | Freq: Three times a day (TID) | INTRAMUSCULAR | Status: DC
  Administered 2014-10-29 – 2014-10-31 (×8): 5000 [IU] via SUBCUTANEOUS
  Filled 2014-10-29 (×8): qty 1

## 2014-10-29 NOTE — Plan of Care (Signed)
Problem: Phase I Progression Outcomes Goal: Pain controlled with appropriate interventions Outcome: Not Applicable Date Met:  35/52/17 Goal: OOB as tolerated unless otherwise ordered Outcome: Completed/Met Date Met:  10/29/14 Goal: Voiding-avoid urinary catheter unless indicated Outcome: Completed/Met Date Met:  10/29/14 Goal: Hemodynamically stable Outcome: Completed/Met Date Met:  10/29/14

## 2014-10-29 NOTE — Progress Notes (Signed)
  Echocardiogram 2D Echocardiogram has been performed.  Janalyn HarderWest, Starasia Sinko R 10/29/2014, 11:34 AM

## 2014-10-29 NOTE — Progress Notes (Signed)
[facility] PROGRESS NOTE  Starla LinkWilliam Binz [id number] DOB: June 03, 1926 DOA: 10/27/2014 PCP: No primary care provider on file.  Assessment/Plan: 1. LBBB 1. Unknown if acute or chronic 2. Pt without obvious chest pains, no diaphoresis or nausea 3. Pt does have hx of prior MI, so this finding may be chronic. No old EKG to compare to. 4. 2d echo neg for obvious for WMA 5. Repeat ekg without changes 6. Serial trop neg 7. Pt asymptomatic 8. Given the above, suspect LBBB is a chronic process resultant from previous MI 9. Consider holding further heparin gtt and cont monitor 2. ARF 1. Cont IVF hydration 2. Hold lasix 3. Renal function improving 4. Pt reportedly voiding briskly - unable to measure secondary to incontinence 3. Hypernatremia 1. Holding lasix 2. Suspect secondary to dehydration 3. Improving with IVF 4. HTN 1. BP stable  2. Cont current regimen 5. BPH 1. Cont proscar 6. DVT prophylaxis 1. Cont on heparin subq  Code Status: Full Family Communication: Pt in room Disposition Plan: Pending   Consultants:    Procedures:    Antibiotics:    HPI/Subjective: No acute events noted. Pt remains confused this AM  Objective: Filed Vitals:   10/28/14 2154 10/28/14 2257 10/29/14 0551 10/29/14 0909  BP: 142/84 132/72 133/70 170/72  Pulse:  88 74 91  Temp:  98.4 F (36.9 C) 98.4 F (36.9 C)   TempSrc:  Oral Oral   Resp:  20 20   Height:      Weight:      SpO2:  100% 100%     Intake/Output Summary (Last 24 hours) at 10/29/14 1322 Last data filed at 10/29/14 1000  Gross per 24 hour  Intake   2610 ml  Output    802 ml  Net   1808 ml   Filed Weights   10/28/14 0256  Weight: 88.27 kg (194 lb 9.6 oz)    Exam:   General:  Awake, in nad  Cardiovascular: regular, s1, s2  Respiratory: normal resp effort, no wheezing  Abdomen: soft,nondistended  Musculoskeletal: perfused, no clubbing   Data Reviewed: Basic Metabolic Panel:  Recent  Labs Lab 10/28/14 0014 10/28/14 0623 10/28/14 1621 10/29/14 0559  NA 150* 155* 149* 148*  K 4.6 4.5 4.3 4.2  CL 112 120* 112 114*  CO2 22 22 21 22   GLUCOSE 231* 115* 157* 122*  BUN 102* 99* 90* 76*  CREATININE 4.62* 4.22* 3.55* 2.94*  CALCIUM 9.6 9.1 9.3 8.9   Liver Function Tests:  Recent Labs Lab 10/28/14 0014  AST 9  ALT 10  ALKPHOS 87  BILITOT 0.2*  PROT 8.1  ALBUMIN 3.1*   No results for input(s): LIPASE, AMYLASE in the last 168 hours.  Recent Labs Lab 10/28/14 0014  AMMONIA 26   CBC:  Recent Labs Lab 10/28/14 0014 10/28/14 0621 10/29/14 0559  WBC 8.7 10.8* 8.1  NEUTROABS 7.7  --   --   HGB 11.4* 11.0* 10.9*  HCT 37.3* 35.3* 34.7*  MCV 92.8 92.2 91.6  PLT 204 216 206   Cardiac Enzymes:  Recent Labs Lab 10/28/14 1112 10/28/14 1310 10/28/14 1624  TROPONINI <0.30 <0.30 <0.30   BNP (last 3 results) No results for input(s): PROBNP in the last 8760 hours. CBG: No results for input(s): GLUCAP in the last 168 hours.  Recent Results (from the past 240 hour(s))  MRSA PCR Screening     Status: None   Collection Time: 10/28/14  3:40 AM  Result Value Ref Range Status  MRSA by PCR NEGATIVE NEGATIVE Final    Comment:        The GeneXpert MRSA Assay (FDA approved for NASAL specimens only), is one component of a comprehensive MRSA colonization surveillance program. It is not intended to diagnose MRSA infection nor to guide or monitor treatment for MRSA infections.      Studies: Ct Head Wo Contrast  10/28/2014   CLINICAL DATA:  Acute onset of altered mental status and generalized weakness. Initial encounter.  EXAM: CT HEAD WITHOUT CONTRAST  TECHNIQUE: Contiguous axial images were obtained from the base of the skull through the vertex without intravenous contrast.  COMPARISON:  None.  FINDINGS: There is no evidence of acute infarction, mass lesion, or intra- or extra-axial hemorrhage on CT.  Prominence of the ventricles and sulci reflects mild  cortical volume loss. Scattered periventricular and subcortical white matter change likely reflects small vessel ischemic microangiopathy. Cerebellar atrophy is noted.  The brainstem and fourth ventricle are within normal limits. The basal ganglia are unremarkable in appearance. The cerebral hemispheres demonstrate grossly normal gray-white differentiation. No mass effect or midline shift is seen.  There is no evidence of fracture; visualized osseous structures are unremarkable in appearance. The visualized portions of the orbits are within normal limits. Chronic mucoperiosteal thickening is noted at the right maxillary sinus. The remaining paranasal sinuses and mastoid air cells are well-aerated. No significant soft tissue abnormalities are seen.  IMPRESSION: 1. No acute intracranial pathology seen on CT. 2. Mild cortical volume loss and scattered small vessel ischemic microangiopathy. 3. Chronic mucoperiosteal thickening at the right maxillary sinus.   Electronically Signed   By: Roanna RaiderJeffery  Chang M.D.   On: 10/28/2014 01:03   Dg Chest Port 1 View  10/28/2014   CLINICAL DATA:  80 year old male with altered mental status and generalized weakness  EXAM: PORTABLE CHEST - 1 VIEW  COMPARISON:  CT scan of the head performed earlier today  FINDINGS: Mild cardiomegaly. Inspiratory volumes are low and there are bibasilar opacities. Normal mediastinal contours. No pneumothorax. No acute osseous abnormality.  IMPRESSION: 1. Low inspiratory volumes with bibasilar opacities favored to reflect atelectasis. Superimposed infiltrate would be difficult to exclude radiographically. 2. Mild cardiomegaly.   Electronically Signed   By: Malachy MoanHeath  McCullough M.D.   On: 10/28/2014 01:24    Scheduled Meds: . sodium chloride  1,000 mL Intravenous Once  . amLODipine  10 mg Oral Daily  . aspirin  81 mg Oral Daily  . finasteride  5 mg Oral Daily  . heparin subcutaneous  5,000 Units Subcutaneous 3 times per day  . hydrALAZINE  25 mg Oral  TID  . Influenza vac split quadrivalent PF  0.5 mL Intramuscular Tomorrow-1000  . pneumococcal 23 valent vaccine  0.5 mL Intramuscular Tomorrow-1000   Continuous Infusions: . sodium chloride 100 mL/hr at 10/29/14 [id number]    Principal Problem:   Altered mental status Active Problems:   Acute kidney injury   Dementia   Hypernatremia   BPH (benign prostatic hyperplasia)   Essential hypertension   History of myocardial infarction   Left bundle branch block   Normochromic normocytic anemia   Acute renal failure syndrome    Time spent: 30min    CHIU, STEPHEN K  [facility] Pager [phone]. If 7PM-7AM, please contact night-coverage at [email or web address], password [email or web address] 10/29/2014, 1:22 PM  LOS: 2 days

## 2014-10-30 ENCOUNTER — Encounter (HOSPITAL_COMMUNITY): Payer: Self-pay | Admitting: *Deleted

## 2014-10-30 DIAGNOSIS — I10 Essential (primary) hypertension: Secondary | ICD-10-CM

## 2014-10-30 LAB — CBC
HEMATOCRIT: 32.1 % — AB (ref 39.0–52.0)
Hemoglobin: 10.3 g/dL — ABNORMAL LOW (ref 13.0–17.0)
MCH: 28.9 pg (ref 26.0–34.0)
MCHC: 32.1 g/dL (ref 30.0–36.0)
MCV: 90.2 fL (ref 78.0–100.0)
Platelets: 179 10*3/uL (ref 150–400)
RBC: 3.56 MIL/uL — ABNORMAL LOW (ref 4.22–5.81)
RDW: 13.4 % (ref 11.5–15.5)
WBC: 6.3 10*3/uL (ref 4.0–10.5)

## 2014-10-30 LAB — BASIC METABOLIC PANEL
Anion gap: 11 (ref 5–15)
BUN: 57 mg/dL — AB (ref 6–23)
CALCIUM: 8.7 mg/dL (ref 8.4–10.5)
CO2: 22 meq/L (ref 19–32)
CREATININE: 2.43 mg/dL — AB (ref 0.50–1.35)
Chloride: 110 mEq/L (ref 96–112)
GFR calc Af Amer: 26 mL/min — ABNORMAL LOW (ref >90)
GFR calc non Af Amer: 23 mL/min — ABNORMAL LOW (ref >90)
Glucose, Bld: 112 mg/dL — ABNORMAL HIGH (ref 70–99)
Potassium: 4.3 mEq/L (ref 3.7–5.3)
Sodium: 143 mEq/L (ref 137–147)

## 2014-10-30 NOTE — Clinical Social Work Psychosocial (Signed)
Clinical Social Work Department BRIEF PSYCHOSOCIAL ASSESSMENT 10/30/2014  Patient:  Daniel Holt,Daniel Holt     Account Number:  [id number]     Admit date:  10/27/2014  Clinical Social Worker:  Nancie NeasSTULTZ,Naysa Puskas, LCSW  Date/Time:  10/30/2014 02:58 PM  Referred by:  CSW  Date Referred:  10/30/2014 Referred for  ALF Placement   Other Referral:   Interview type:  Family Other interview type:   son- Wiley    PSYCHOSOCIAL DATA Living Status:  FACILITY Admitted from facility:  [facility] Level of care:  Assisted Living Primary support name:  Wiley Primary support relationship to patient:  CHILD, ADULT Degree of support available:   limited due to health issues    CURRENT CONCERNS Current Concerns  Post-Acute Placement   Other Concerns:    SOCIAL WORK ASSESSMENT / PLAN CSW spoke with pt's son Wiley ([phone], [phone]) on phone as pt is oriented to self only. Wiley reports pt [facility] a [facility]. He is on the memory care unit. Pt has 5 children. Wiley indicates that he has been unable to visit pt often due to his cancer treatment and living 30 miles away. He is doing better now and is hopeful to see pt every week with his brother. Wiley [facility]. Per Crystal at facility, pt generally does well there. He ambulates unassisted, but requires assistance with dressing, bathing, and toileting. Pt is oriented to self only at baseline. He will carry on a conversation with staff although it may not always make sense. Wiley reports that pt generally recognizes him but not his other siblings. Pt is okay for return.   Assessment/plan status:  Psychosocial Support/Ongoing Assessment of Needs Other assessment/ plan:   Information/referral to community resources:   [facility]    PATIENT'S/FAMILY'S RESPONSE TO PLAN OF CARE: Pt unable to discuss plan of care due to dementia. Pt's son reports positive feelings regarding return to  [facility] when medically stable. PT to evaluate tomorrow. CSW will continue to follow.       Derenda FennelKara Remus Hagedorn, [place] [phone]

## 2014-10-30 NOTE — Clinical Documentation Improvement (Signed)
Supporting Information: AMS, history of dementia per 11/28 progress notes.    Possible Diagnosis? . Document the etiology of the altered mental status as: --Confusion/delirium (including drug-induced) --Drowsiness/somnolence --Stupor/semi-coma --Transient alteration of awareness --Encephalopathy Alcoholic Anoxic/hypoxic Drug-induced/toxic (specify drug) Hepatic Hypertensive Hypoglycemic Metabolic/septic Traumatic/post-concussion Wernicke Other (specify) . Document any associated diagnoses/conditions    Thank Gabriel CirriYou,   Viviann Broyles Mathews-Bethea,RN,BSN,Clinical Documentation Specialist [phone] [email or web address]

## 2014-10-30 NOTE — Plan of Care (Addendum)
Problem: Phase I Progression Outcomes Goal: Initial discharge plan identified Outcome: Completed/Met Date Met:  10/30/14 Pt to return to [facility]

## 2014-10-30 NOTE — Evaluation (Signed)
Clinical/Bedside Swallow Evaluation Patient Details  Name: Daniel Holt MRN: [id number] DOB: [birth year]  Today's Date: 10/30/2014 Time: 12:45 PM  - 1:15 PM    Past Medical History:  Past Medical History  Diagnosis Date  . Hypertension   . Dementia   . Liver mass   . BPH (benign prostatic hyperplasia)   . Cardiomyopathy    Past Surgical History: History reviewed. No pertinent past surgical history. HPI:  Daniel Holt is a 80 y.o. male who [facility] with altered mental status and no family present for admission. Report given by Dr. Preston FleetingGlick in the ED. Pts caretaker at the nursing home not available for consultation at time of admission    Assessment/Recommendations/Treatment Plan    SLP Assessment Clinical Impression Statement: Daniel Holt shows no overt signs or symptoms of aspiration at bedside. Oral motor examination is unremarkable; delayed responses but does follow commands. RN reports no difficulty taking meds this am and tech reports no difficulty with breakfast meal except for prolonged mastication with sausage. Recommend D3/mech soft with thin liquids due to edentulous status. Pt will need supervision and set up assist for po due to alterted cognition. SLP will follow x1 for diet tolerance while in acute setting. No family present during evaluation.  Risk for Aspiration: Mild Other Related Risk Factors: Cognitive impairment  Swallow Evaluation Recommendations Diet Recommendations: Dysphagia 3 (Mechanical Soft), Thin liquid Liquid Administration via: Cup, Straw Medication Administration: Whole meds with liquid Supervision: Intermittent supervision to cue for compensatory strategies, Staff to assist with self feeding Compensations: Slow rate Postural Changes and/or Swallow Maneuvers: Seated upright 90 degrees, Upright 30-60 min after meal Oral Care Recommendations: Oral care BID Other Recommendations: Clarify dietary restrictions Follow up  Recommendations: 24 hour supervision/assistance  Treatment Plan Treatment Plan Recommendations: Therapy as outlined in treatment plan below Speech Therapy Frequency (ACUTE ONLY): min 1 x/week Treatment Duration: 1 week Interventions: Patient/family education, Diet toleration management by SLP  Prognosis Prognosis for Safe Diet Advancement: Good Barriers to Reach Goals: Cognitive deficits  Individuals Consulted Consulted and Agree with Results and Recommendations: Patient, Patient unable/family or caregiver not available, RN  Swallowing Goals    Pt will demonstrate safe and efficient consumption of least restrictive diet with use of strategies as needed.  Swallow Study Prior Functional Status   [facility]  General  Date of Onset: 10/27/14 HPI: Daniel Holt is a 80 y.o. male who [facility] with altered mental status and no family present for admission. Report given by Dr. Preston FleetingGlick in the ED. Pts caretaker at the nursing home not available for consultation at time of admission  Type of Study: Bedside swallow evaluation Previous Swallow Assessment: None on record Diet Prior to this Study: Regular, Thin liquids Temperature Spikes Noted: No Respiratory Status: Room air History of Recent Intubation: No Behavior/Cognition: Alert, Cooperative, Requires cueing Oral Cavity - Dentition: Edentulous Self-Feeding Abilities: Needs set up Patient Positioning: Upright in bed Baseline Vocal Quality: Clear, Low vocal intensity Volitional Cough: Cognitively unable to elicit Volitional Swallow: Able to elicit  Oral Motor/Sensory Function  Overall Oral Motor/Sensory Function: Appears within functional limits for tasks assessed  Consistency Results  Ice Chips Ice chips: Within functional limits Presentation: Spoon  Thin Liquid Thin Liquid: Within functional limits Presentation: Straw;Self Fed  Nectar Thick Liquid Nectar Thick Liquid: Not  tested  Honey Thick Liquid Honey Thick Liquid: Not tested  Puree Puree: Within functional limits Presentation: Spoon  Solid Solid: Impaired  Presentation: Spoon Oral Phase Impairments: Impaired mastication Oral Phase Functional Implications:  (delayed oral transit due to edentulous status) Thank you,  Havery MorosDabney Cong Hightower, CCC-SLP [phone]   Ha Shannahan 10/30/2014,4:56 PM

## 2014-10-30 NOTE — Progress Notes (Addendum)
[facility] PROGRESS NOTE  Daniel LinkWilliam Weinmann [id number] DOB: 23-Jan-1926 DOA: 10/27/2014 PCP: No primary care provider on file.  Assessment/Plan: 1. LBBB 1. Unknown if acute or chronic 2. Pt without obvious chest pains, no diaphoresis or nausea 3. Pt does have hx of prior MI, so suspecting LBBB may be chronic and not acute. No old EKG to compare to. 4. 2d echo neg for obvious for WMA 5. Serial repeat ekg without changes 6. Serial trop neg 7. Pt asymptomatic 8. Consider holding further heparin gtt and cont monitor 2. ARF 1. Cont IVF hydration 2. Hold lasix 3. Renal function improving 4. Pt reportedly voiding briskly - unable to measure secondary to incontinence 3. Hypernatremia 1. Holding lasix 2. Suspect secondary to dehydration 3. Improving with IVF 4. HTN 1. BP stable  2. Cont current regimen 5. BPH 1. Cont proscar 6. DVT prophylaxis 1. Cont on heparin subq 7. Acute delerium 1. Pt still confused, speaking one word responses 2. Hopeful for improvement soon as renal function and sodium levels improving  Code Status: Full Family Communication: Pt in room Disposition Plan: Pending   Consultants:    Procedures:    Antibiotics:    HPI/Subjective: No acute events noted. Pt remains confused this AM  Objective: Filed Vitals:   10/29/14 1409 10/29/14 2142 10/30/14 0435 10/30/14 0707  BP: 107/55 142/66 118/57 117/56  Pulse: 92 78 86 86  Temp: 98.6 F (37 C) 98.2 F (36.8 C) 98.7 F (37.1 C) 98.7 F (37.1 C)  TempSrc: Axillary Oral Oral Oral  Resp:  18 18 18   Height:      Weight:      SpO2: 98% 100% 96% 96%    Intake/Output Summary (Last 24 hours) at 10/30/14 0928 Last data filed at 10/29/14 1844  Gross per 24 hour  Intake 1953.33 ml  Output    301 ml  Net 1652.33 ml   Filed Weights   10/28/14 0256  Weight: 88.27 kg (194 lb 9.6 oz)    Exam:   General:  Awake, in nad  Cardiovascular: regular, s1, s2  Respiratory: normal resp  effort, no wheezing  Abdomen: soft,nondistended  Musculoskeletal: perfused, no clubbing   Data Reviewed: Basic Metabolic Panel:  Recent Labs Lab 10/28/14 0014 10/28/14 0623 10/28/14 1621 10/29/14 0559 10/30/14 0645  NA 150* 155* 149* 148* 143  K 4.6 4.5 4.3 4.2 4.3  CL 112 120* 112 114* 110  CO2 22 22 21 22 22   GLUCOSE 231* 115* 157* 122* 112*  BUN 102* 99* 90* 76* 57*  CREATININE 4.62* 4.22* 3.55* 2.94* 2.43*  CALCIUM 9.6 9.1 9.3 8.9 8.7   Liver Function Tests:  Recent Labs Lab 10/28/14 0014  AST 9  ALT 10  ALKPHOS 87  BILITOT 0.2*  PROT 8.1  ALBUMIN 3.1*   No results for input(s): LIPASE, AMYLASE in the last 168 hours.  Recent Labs Lab 10/28/14 0014  AMMONIA 26   CBC:  Recent Labs Lab 10/28/14 0014 10/28/14 0621 10/29/14 0559 10/30/14 0645  WBC 8.7 10.8* 8.1 6.3  NEUTROABS 7.7  --   --   --   HGB 11.4* 11.0* 10.9* 10.3*  HCT 37.3* 35.3* 34.7* 32.1*  MCV 92.8 92.2 91.6 90.2  PLT 204 216 206 179   Cardiac Enzymes:  Recent Labs Lab 10/28/14 1112 10/28/14 1310 10/28/14 1624  TROPONINI <0.30 <0.30 <0.30   BNP (last 3 results) No results for input(s): PROBNP in the last 8760 hours. CBG: No results for input(s): GLUCAP in the  last 168 hours.  Recent Results (from the past 240 hour(s))  MRSA PCR Screening     Status: None   Collection Time: 10/28/14  3:40 AM  Result Value Ref Range Status   MRSA by PCR NEGATIVE NEGATIVE Final    Comment:        The GeneXpert MRSA Assay (FDA approved for NASAL specimens only), is one component of a comprehensive MRSA colonization surveillance program. It is not intended to diagnose MRSA infection nor to guide or monitor treatment for MRSA infections.      Studies: No results found.  Scheduled Meds: . sodium chloride  1,000 mL Intravenous Once  . amLODipine  10 mg Oral Daily  . aspirin  81 mg Oral Daily  . finasteride  5 mg Oral Daily  . heparin subcutaneous  5,000 Units Subcutaneous 3 times  per day  . hydrALAZINE  25 mg Oral TID   Continuous Infusions: . sodium chloride 100 mL/hr at 10/30/14 [id number]    Principal Problem:   Altered mental status Active Problems:   Acute kidney injury   Dementia   Hypernatremia   BPH (benign prostatic hyperplasia)   Essential hypertension   History of myocardial infarction   Left bundle branch block   Normochromic normocytic anemia   Acute renal failure syndrome    Time spent: 30min    Ruhee Enck K  [facility] Pager [phone]. If 7PM-7AM, please contact night-coverage at [email or web address], password [email or web address] 10/30/2014, 9:28 AM  LOS: 3 days

## 2014-10-30 NOTE — Care Management Utilization Note (Signed)
UR completed 

## 2014-10-30 NOTE — Care Management Note (Addendum)
    Page 1 of 1   10/31/2014     10:46:56 AM CARE MANAGEMENT NOTE 10/31/2014  Patient:  Starla LinkBRIGGS,Cranford   Account Number:  [id number]  Date Initiated:  10/30/2014  Documentation initiated by:  Kathyrn SheriffHILDRESS,JESSICA  Subjective/Objective Assessment:   [facility]     Action/Plan:   Pt plans to [facility]. No CM needs identified at this time. CSW aware of discharge plan and will make arrangments for return to facility.   Anticipated DC Date:  10/30/2014   Anticipated DC Plan:  ASSISTED LIVING / REST HOME  In-house referral  Clinical Social Worker      DC Planning Services  CM consult      Choice offered to / List presented to:             Status of service:  Completed, signed off Medicare Important Message given?  YES (If response is "NO", the following Medicare IM given date fields will be blank) Date Medicare IM given:  10/30/2014 Medicare IM given by:  Kathyrn SheriffHILDRESS,JESSICA Date Additional Medicare IM given:   Additional Medicare IM given by:    Discharge Disposition:  ASSISTED LIVING  Per UR Regulation:    If discussed at Long Length of Stay Meetings, dates discussed:    Comments:  10/31/2014 1045 Kathyrn SheriffJessica Childress, RN, MSN, PCCN Pt being dishcarged today back [facility]. Pt's family and facility aware of dishcarge plan. CSW arranging for return to facility. No CM needs identified at this time.  10/30/2014 1330 Kathyrn SheriffJessica Childress, RN, MSN, [facility]

## 2014-10-31 DIAGNOSIS — N178 Other acute kidney failure: Secondary | ICD-10-CM

## 2014-10-31 LAB — BASIC METABOLIC PANEL
Anion gap: 12 (ref 5–15)
BUN: 45 mg/dL — ABNORMAL HIGH (ref 6–23)
CALCIUM: 8.8 mg/dL (ref 8.4–10.5)
CO2: 21 mEq/L (ref 19–32)
Chloride: 107 mEq/L (ref 96–112)
Creatinine, Ser: 2.2 mg/dL — ABNORMAL HIGH (ref 0.50–1.35)
GFR calc Af Amer: 29 mL/min — ABNORMAL LOW (ref >90)
GFR calc non Af Amer: 25 mL/min — ABNORMAL LOW (ref >90)
GLUCOSE: 93 mg/dL (ref 70–99)
Potassium: 4.3 mEq/L (ref 3.7–5.3)
SODIUM: 140 meq/L (ref 137–147)

## 2014-10-31 LAB — CBC
HCT: 32.8 % — ABNORMAL LOW (ref 39.0–52.0)
HEMOGLOBIN: 10.6 g/dL — AB (ref 13.0–17.0)
MCH: 28.6 pg (ref 26.0–34.0)
MCHC: 32.3 g/dL (ref 30.0–36.0)
MCV: 88.4 fL (ref 78.0–100.0)
Platelets: 201 10*3/uL (ref 150–400)
RBC: 3.71 MIL/uL — ABNORMAL LOW (ref 4.22–5.81)
RDW: 13.3 % (ref 11.5–15.5)
WBC: 7.8 10*3/uL (ref 4.0–10.5)

## 2014-10-31 MED ORDER — FUROSEMIDE 20 MG PO TABS: 20.0000 mg | ORAL_TABLET | Freq: Every day | ORAL | Status: AC

## 2014-10-31 MED ORDER — OXYCODONE HCL 5 MG PO TABS: 5.0000 mg | ORAL_TABLET | ORAL | Status: AC | PRN

## 2014-10-31 NOTE — Clinical Social Work Note (Signed)
[facility]. Facility aware and agreeable and will pick up pt this afternoon. CSW left voicemail for pt's son, Paris LoreWiley. D/C summary and FL2 faxed.  Derenda FennelKara Mykhia Danish, [place]CS[place] [phone]

## 2014-10-31 NOTE — Evaluation (Signed)
Physical Therapy Evaluation Patient Details Name: Daniel LinkWilliam Holt MRN: [id number] DOB: 01-30-1926 Today's Date: 10/31/2014   History of Present Illness  Pt is an 80 year old resident of the memory unit of [facility] who was admitted for altered mental status.  He was found to have acute renal failure and hypernatremia.  Pt normally needs assist with all personal grooming and is able to ambulate independently with no assistive device.  He is incontinent.  Clinical Impression   Pt was found to be alert and cooperative but had significant difficulty following directions.  Initially he was quite silent but as the visit progressed he was able to express himself in full sentences.  He is clearly more disoriented in the hospital environment with frequent loss of focus and distractability.  His transfers and gait are stable with no assistive device and I feel confident that when he returns to [facility] he will be back to baseline.  No further PT is indicated.    Follow Up Recommendations No PT follow up    Equipment Recommendations  None recommended by PT    Recommendations for Other Services   none    Precautions / Restrictions Precautions Precautions: Fall Restrictions Weight Bearing Restrictions: No      Mobility  Bed Mobility Overal bed mobility: Needs Assistance Bed Mobility: Supine to Sit     Supine to sit: Supervision     General bed mobility comments: stability getting out of bed was stable but pt was easily distracted in this unfamiliar environment and had difficulty understanding my directions  Transfers Overall transfer level: Needs assistance Equipment used: None Transfers: Sit to/from Stand Sit to Stand: Supervision            Ambulation/Gait Ambulation/Gait assistance: Supervision Ambulation Distance (Feet): 100 Feet Assistive device: None Gait Pattern/deviations: WFL(Within Functional Limits);Trunk flexed   Gait velocity interpretation: Below normal speed for  age/gender General Gait Details: pt likes to wear his shoes for gait so these were placed on his feet...gait was stable with straightforward gait and turns...gait was not challenged with higher level activities  Stairs            Wheelchair Mobility    Modified Rankin (Stroke Patients Only)       Balance Overall balance assessment: No apparent balance deficits (not formally assessed)                                           Pertinent Vitals/Pain Pain Assessment: No/denies pain    Home Living Family/patient expects to be discharged to:: Assisted living               Home Equipment: None      Prior Function Level of Independence: Needs assistance   Gait / Transfers Assistance Needed: transfers and ambulates with no assistive device independently  ADL's / Homemaking Assistance Needed: assist with personal grooming         Hand Dominance        Extremity/Trunk Assessment               Lower Extremity Assessment: Overall WFL for tasks assessed      Cervical / Trunk Assessment: Kyphotic  Communication   Communication: Expressive difficulties (able now to speak in sentences but this is not frequent)  Cognition Arousal/Alertness: Awake/alert Behavior During Therapy: WFL for tasks assessed/performed Overall Cognitive Status: History of cognitive impairments - at  baseline                      General Comments      Exercises        Assessment/Plan    PT Assessment Patent does not need any further PT services  PT Diagnosis     PT Problem List    PT Treatment Interventions     PT Goals (Current goals can be found in the Care Plan section) Acute Rehab PT Goals PT Goal Formulation: All assessment and education complete, DC therapy    Frequency     Barriers to discharge  none      Co-evaluation               End of Session Equipment Utilized During Treatment: Gait belt Activity Tolerance: Patient  tolerated treatment well Patient left: in chair;with call bell/phone within reach;with nursing/sitter in room           Time: 0830-0904 PT Time Calculation (min) (ACUTE ONLY): 34 min   Charges:   PT Evaluation $Initial PT Evaluation Tier I: 1 Procedure     PT G CodesMyrlene Holt:          Daniel Holt L 10/31/2014, 9:20 AM

## 2014-10-31 NOTE — Discharge Summary (Signed)
Physician Discharge Summary  Daniel LinkWilliam Holt ZOX:[id number] DOB: [birth year] DOA: 10/27/2014  PCP: No primary care provider on file.  Admit date: 10/27/2014 Discharge date: 10/31/2014  Time spent: 35 minutes  Recommendations for Outpatient Follow-up:  1. Follow up with PCP in 1-2 weeks  Discharge Diagnoses:  Principal Problem:   Altered mental status Active Problems:   Acute kidney injury   Dementia   Hypernatremia   BPH (benign prostatic hyperplasia)   Essential hypertension   History of myocardial infarction   Left bundle branch block   Normochromic normocytic anemia   Acute renal failure syndrome  Discharge Condition: Improved  Diet recommendation: Dysphagia 3  Filed Weights   10/28/14 0256  Weight: 88.27 kg (194 lb 9.6 oz)   History of present illness:  Please see admit h and p from 11/28 for details. Briefly, pt presents with acute delirium, found to be in profound acute renal failure. The patient was admitted for further work up.  Hospital Course: 1. LBBB 1. Pt without obvious chest pains, no diaphoresis or nausea 2. Pt does have hx of prior MI, so suspecting LBBB may be chronic and not acute. No old EKG to compare to. 3. 2d echo was neg for obvious for WMA, grade 1 diastolic dysfunction noted 4. Serial repeat ekg's without changes 5. Serial trop were neg 6. Pt remained asymptomatic 7. Stopped empiric heparin gtt and cont monitor 2. ARF 1. Cont IVF hydration while inpatient 2. Renal function improving nicely with fluids 3. Pt reportedly voiding briskly - unable to measure secondary to incontinence 4. Decreased dose of lasix on discharge 3. Hypernatremia 1. Held lasix while inpatient 2. Suspect secondary to dehydration 3. Improved with IVF 4. HTN 1. BP stable  2. Cont current regimen 5. BPH 1. Cont proscar 6. DVT prophylaxis 1. Cont on heparin subq 7. Acute delerium 1. Pt still confused, likely baseline 2. Pt speaking in sentences this AM 3. Delirium  likely secondary to profound acute renal failure 8. Grade 1 diastolic dysfunction 1. Normal LVEF 2. Lasix on hold while in the hospital 3. Pt to be discharged on decreased dose of lasix at 20mg  daily, presented initially with 40mg  bid  Discharge Exam: Filed Vitals:   10/30/14 1500 10/30/14 2150 10/31/14 0604 10/31/14 0825  BP: 135/65 114/62 124/72 123/56  Pulse: 87 85 93 79  Temp: 97.7 F (36.5 C) 99.4 F (37.4 C)    TempSrc: Oral Axillary    Resp: 18 20 20    Height:      Weight:      SpO2: 98%  98%     General: Awake, in nad Cardiovascular: regular, s1, s2 Respiratory: normal resp effort, no wheezing  Discharge Instructions     Medication List    TAKE these medications        amLODipine 10 MG tablet  Commonly known as:  NORVASC  Take 10 mg by mouth daily.     aspirin 81 MG tablet  Take 81 mg by mouth daily.     ferrous sulfate 325 (65 FE) MG tablet  Take 325 mg by mouth daily with breakfast.     finasteride 5 MG tablet  Commonly known as:  PROSCAR  Take 5 mg by mouth daily.     furosemide 20 MG tablet  Commonly known as:  LASIX  Take 1 tablet (20 mg total) by mouth daily.     hydrALAZINE 25 MG tablet  Commonly known as:  APRESOLINE  Take 25 mg by mouth 3 (three)  times daily.     oxyCODONE 5 MG immediate release tablet  Commonly known as:  Oxy IR/ROXICODONE  Take 1 tablet (5 mg total) by mouth every 4 (four) hours as needed for moderate pain.       No Known Allergies Follow-up Information    Follow up with follow up with PCP in 1-2 weeks. Schedule an appointment as soon as possible for a visit in 1 week.       The results of significant diagnostics from this hospitalization (including imaging, microbiology, ancillary and laboratory) are listed below for reference.    Significant Diagnostic Studies: Ct Head Wo Contrast  10/28/2014   CLINICAL DATA:  Acute onset of altered mental status and generalized weakness. Initial encounter.  EXAM: CT HEAD  WITHOUT CONTRAST  TECHNIQUE: Contiguous axial images were obtained from the base of the skull through the vertex without intravenous contrast.  COMPARISON:  None.  FINDINGS: There is no evidence of acute infarction, mass lesion, or intra- or extra-axial hemorrhage on CT.  Prominence of the ventricles and sulci reflects mild cortical volume loss. Scattered periventricular and subcortical white matter change likely reflects small vessel ischemic microangiopathy. Cerebellar atrophy is noted.  The brainstem and fourth ventricle are within normal limits. The basal ganglia are unremarkable in appearance. The cerebral hemispheres demonstrate grossly normal gray-white differentiation. No mass effect or midline shift is seen.  There is no evidence of fracture; visualized osseous structures are unremarkable in appearance. The visualized portions of the orbits are within normal limits. Chronic mucoperiosteal thickening is noted at the right maxillary sinus. The remaining paranasal sinuses and mastoid air cells are well-aerated. No significant soft tissue abnormalities are seen.  IMPRESSION: 1. No acute intracranial pathology seen on CT. 2. Mild cortical volume loss and scattered small vessel ischemic microangiopathy. 3. Chronic mucoperiosteal thickening at the right maxillary sinus.   Electronically Signed   By: Roanna RaiderJeffery  Chang M.D.   On: 10/28/2014 01:03   Dg Chest Port 1 View  10/28/2014   CLINICAL DATA:  80 year old male with altered mental status and generalized weakness  EXAM: PORTABLE CHEST - 1 VIEW  COMPARISON:  CT scan of the head performed earlier today  FINDINGS: Mild cardiomegaly. Inspiratory volumes are low and there are bibasilar opacities. Normal mediastinal contours. No pneumothorax. No acute osseous abnormality.  IMPRESSION: 1. Low inspiratory volumes with bibasilar opacities favored to reflect atelectasis. Superimposed infiltrate would be difficult to exclude radiographically. 2. Mild cardiomegaly.    Electronically Signed   By: Malachy MoanHeath  McCullough M.D.   On: 10/28/2014 01:24    Microbiology: Recent Results (from the past 240 hour(s))  MRSA PCR Screening     Status: None   Collection Time: 10/28/14  3:40 AM  Result Value Ref Range Status   MRSA by PCR NEGATIVE NEGATIVE Final    Comment:        The GeneXpert MRSA Assay (FDA approved for NASAL specimens only), is one component of a comprehensive MRSA colonization surveillance program. It is not intended to diagnose MRSA infection nor to guide or monitor treatment for MRSA infections.      Labs: Basic Metabolic Panel:  Recent Labs Lab 10/28/14 0623 10/28/14 1621 10/29/14 0559 10/30/14 0645 10/31/14 0542  NA 155* 149* 148* 143 140  K 4.5 4.3 4.2 4.3 4.3  CL 120* 112 114* 110 107  CO2 22 21 22 22 21   GLUCOSE 115* 157* 122* 112* 93  BUN 99* 90* 76* 57* 45*  CREATININE 4.22* 3.55* 2.94* 2.43*  2.20*  CALCIUM 9.1 9.3 8.9 8.7 8.8   Liver Function Tests:  Recent Labs Lab 10/28/14 0014  AST 9  ALT 10  ALKPHOS 87  BILITOT 0.2*  PROT 8.1  ALBUMIN 3.1*   No results for input(s): LIPASE, AMYLASE in the last 168 hours.  Recent Labs Lab 10/28/14 0014  AMMONIA 26   CBC:  Recent Labs Lab 10/28/14 0014 10/28/14 0621 10/29/14 0559 10/30/14 0645 10/31/14 0542  WBC 8.7 10.8* 8.1 6.3 7.8  NEUTROABS 7.7  --   --   --   --   HGB 11.4* 11.0* 10.9* 10.3* 10.6*  HCT 37.3* 35.3* 34.7* 32.1* 32.8*  MCV 92.8 92.2 91.6 90.2 88.4  PLT 204 216 206 179 201   Cardiac Enzymes:  Recent Labs Lab 10/28/14 1112 10/28/14 1310 10/28/14 1624  TROPONINI <0.30 <0.30 <0.30   BNP: BNP (last 3 results) No results for input(s): PROBNP in the last 8760 hours. CBG: No results for input(s): GLUCAP in the last 168 hours.  Signed:  Tania Steinhauser K  [facility] 10/31/2014, 10:07 AM

## 2014-10-31 NOTE — Progress Notes (Signed)
Patient discharged [facility].  Discharge instructions, prescriptions, care notes and discharge packet [facility].  She verbalized understanding with no complaints or concerns voiced at this time.  IV was accidentally removed by patient with catheter intact, no bleeding or complications.  Patient left unit in stable condition in wheelchair by staff member with [facility] and patient belongings.

## 2014-11-01 NOTE — Care Management Utilization Note (Signed)
UR complete 

## 2014-11-05 ENCOUNTER — Observation Stay (HOSPITAL_COMMUNITY)
Admission: EM | Admit: 2014-11-05 | Discharge: 2014-11-07 | Disposition: A | Discharge status: Home / Self Care | Payer: Medicare Other | Attending: Internal Medicine | Admitting: Internal Medicine

## 2014-11-05 ENCOUNTER — Emergency Department (HOSPITAL_COMMUNITY): Payer: Medicare Other

## 2014-11-05 ENCOUNTER — Encounter (HOSPITAL_COMMUNITY): Payer: Self-pay | Admitting: Emergency Medicine

## 2014-11-05 DIAGNOSIS — N400 Enlarged prostate without lower urinary tract symptoms: Secondary | ICD-10-CM | POA: Diagnosis not present

## 2014-11-05 DIAGNOSIS — I10 Essential (primary) hypertension: Secondary | ICD-10-CM | POA: Insufficient documentation

## 2014-11-05 DIAGNOSIS — Z79899 Other long term (current) drug therapy: Secondary | ICD-10-CM | POA: Diagnosis not present

## 2014-11-05 DIAGNOSIS — K625 Hemorrhage of anus and rectum: Principal | ICD-10-CM | POA: Insufficient documentation

## 2014-11-05 DIAGNOSIS — F0390 Unspecified dementia without behavioral disturbance: Secondary | ICD-10-CM

## 2014-11-05 DIAGNOSIS — N184 Chronic kidney disease, stage 4 (severe): Secondary | ICD-10-CM | POA: Diagnosis present

## 2014-11-05 DIAGNOSIS — I429 Cardiomyopathy, unspecified: Secondary | ICD-10-CM | POA: Insufficient documentation

## 2014-11-05 DIAGNOSIS — Z7982 Long term (current) use of aspirin: Secondary | ICD-10-CM | POA: Diagnosis not present

## 2014-11-05 DIAGNOSIS — D649 Anemia, unspecified: Secondary | ICD-10-CM

## 2014-11-05 LAB — PROTIME-INR
INR: 1.16 (ref 0.00–1.49)
Prothrombin Time: 14.9 seconds (ref 11.6–15.2)

## 2014-11-05 LAB — COMPREHENSIVE METABOLIC PANEL
ALT: 14 U/L (ref 0–53)
ANION GAP: 14 (ref 5–15)
AST: 12 U/L (ref 0–37)
Albumin: 3.2 g/dL — ABNORMAL LOW (ref 3.5–5.2)
Alkaline Phosphatase: 92 U/L (ref 39–117)
BUN: 60 mg/dL — ABNORMAL HIGH (ref 6–23)
CALCIUM: 9.3 mg/dL (ref 8.4–10.5)
CO2: 19 mEq/L (ref 19–32)
Chloride: 110 mEq/L (ref 96–112)
Creatinine, Ser: 2.8 mg/dL — ABNORMAL HIGH (ref 0.50–1.35)
GFR calc non Af Amer: 19 mL/min — ABNORMAL LOW (ref >90)
GFR, EST AFRICAN AMERICAN: 22 mL/min — AB (ref >90)
GLUCOSE: 129 mg/dL — AB (ref 70–99)
Potassium: 4.1 mEq/L (ref 3.7–5.3)
SODIUM: 143 meq/L (ref 137–147)
Total Bilirubin: 0.3 mg/dL (ref 0.3–1.2)
Total Protein: 8.3 g/dL (ref 6.0–8.3)

## 2014-11-05 LAB — CBC WITH DIFFERENTIAL/PLATELET
Basophils Absolute: 0 10*3/uL (ref 0.0–0.1)
Basophils Relative: 0 % (ref 0–1)
Eosinophils Absolute: 0.1 10*3/uL (ref 0.0–0.7)
Eosinophils Relative: 1 % (ref 0–5)
HCT: 33.6 % — ABNORMAL LOW (ref 39.0–52.0)
HEMOGLOBIN: 10.8 g/dL — AB (ref 13.0–17.0)
LYMPHS ABS: 1.1 10*3/uL (ref 0.7–4.0)
Lymphocytes Relative: 17 % (ref 12–46)
MCH: 28.7 pg (ref 26.0–34.0)
MCHC: 32.1 g/dL (ref 30.0–36.0)
MCV: 89.4 fL (ref 78.0–100.0)
MONOS PCT: 6 % (ref 3–12)
Monocytes Absolute: 0.4 10*3/uL (ref 0.1–1.0)
Neutro Abs: 5 10*3/uL (ref 1.7–7.7)
Neutrophils Relative %: 76 % (ref 43–77)
Platelets: 260 10*3/uL (ref 150–400)
RBC: 3.76 MIL/uL — ABNORMAL LOW (ref 4.22–5.81)
RDW: 13.9 % (ref 11.5–15.5)
WBC: 6.5 10*3/uL (ref 4.0–10.5)

## 2014-11-05 LAB — POC OCCULT BLOOD, ED: Fecal Occult Bld: POSITIVE — AB

## 2014-11-05 LAB — TYPE AND SCREEN
ABO/RH(D): A POS
Antibody Screen: NEGATIVE

## 2014-11-05 MED ORDER — OXYCODONE HCL 5 MG PO TABS: 5.0000 mg | ORAL_TABLET | ORAL | Status: DC | PRN

## 2014-11-05 MED ORDER — FINASTERIDE 5 MG PO TABS
5.0000 mg | ORAL_TABLET | Freq: Every day | ORAL | Status: DC
  Administered 2014-11-06 – 2014-11-07 (×2): 5 mg via ORAL
  Filled 2014-11-05 (×5): qty 1

## 2014-11-05 MED ORDER — ACETAMINOPHEN 650 MG RE SUPP
650.0000 mg | Freq: Four times a day (QID) | RECTAL | Status: DC | PRN
Start: 2014-11-05 — End: 2014-11-07

## 2014-11-05 MED ORDER — PANTOPRAZOLE SODIUM 40 MG IV SOLR
40.0000 mg | Freq: Two times a day (BID) | INTRAVENOUS | Status: DC
  Administered 2014-11-05 – 2014-11-07 (×4): 40 mg via INTRAVENOUS
  Filled 2014-11-05 (×4): qty 40

## 2014-11-05 MED ORDER — ONDANSETRON HCL 4 MG/2ML IJ SOLN: 4.0000 mg | Freq: Four times a day (QID) | INTRAMUSCULAR | Status: DC | PRN

## 2014-11-05 MED ORDER — ACETAMINOPHEN 325 MG PO TABS: 650.0000 mg | ORAL_TABLET | Freq: Four times a day (QID) | ORAL | Status: DC | PRN

## 2014-11-05 MED ORDER — SODIUM CHLORIDE 0.9 % IV SOLN
INTRAVENOUS | Status: DC
  Administered 2014-11-05: 19:00:00 via INTRAVENOUS

## 2014-11-05 MED ORDER — AMLODIPINE BESYLATE 5 MG PO TABS
10.0000 mg | ORAL_TABLET | Freq: Every day | ORAL | Status: DC
  Administered 2014-11-05 – 2014-11-07 (×3): 10 mg via ORAL
  Filled 2014-11-05 (×3): qty 2

## 2014-11-05 MED ORDER — ONDANSETRON HCL 4 MG PO TABS: 4.0000 mg | ORAL_TABLET | Freq: Four times a day (QID) | ORAL | Status: DC | PRN

## 2014-11-05 MED ORDER — SODIUM CHLORIDE 0.9 % IV BOLUS (SEPSIS)
500.0000 mL | Freq: Once | INTRAVENOUS | Status: AC
  Administered 2014-11-05: 500 mL via INTRAVENOUS

## 2014-11-05 MED ORDER — HYDRALAZINE HCL 25 MG PO TABS
25.0000 mg | ORAL_TABLET | Freq: Three times a day (TID) | ORAL | Status: DC
  Administered 2014-11-05 – 2014-11-07 (×6): 25 mg via ORAL
  Filled 2014-11-05 (×7): qty 1

## 2014-11-05 MED ORDER — HALOPERIDOL LACTATE 5 MG/ML IJ SOLN
2.0000 mg | Freq: Four times a day (QID) | INTRAMUSCULAR | Status: DC | PRN
  Administered 2014-11-06: 2 mg via INTRAVENOUS
  Filled 2014-11-05: qty 1

## 2014-11-05 NOTE — ED Notes (Signed)
Pt resident of [facility] and brought in by EMS this am for rectal bleeding. Per EMS, staff at [facility] unsure of when bleeding began. Pt alert at time of arrival. nad noted.

## 2014-11-05 NOTE — ED Notes (Signed)
Patient Recruitment consultantafety Sitter

## 2014-11-05 NOTE — H&P (Signed)
[facility] History and Physical  Daniel Holt UJW:[id number] DOB: 1926-07-25 DOA: 11/05/2014  Referring physician: Emergency department PCP: No primary care provider on file.   Chief Complaint: Rectal bleeding  HPI: Daniel Holt is a 80 y.o. male who has dementia and cannot provide any significant history. According to ER staff and notes, patient was at his assisted living facility today when he was found in a "pool of blood". He was brought to the emergency room for evaluation where he was found to be Hemoccult positive. His hemoglobin appears to be similar to previous level which was checked on 12/1 when he was last discharged. No further details are obtainable. Patient is being admitted for observation.   Review of Systems:  Unable to assess since patient is nonverbal   Past Medical History  Diagnosis Date  . Hypertension   . Dementia   . Liver mass   . BPH (benign prostatic hyperplasia)   . Cardiomyopathy    History reviewed. No pertinent past surgical history. Social History:  reports that he has never smoked. He does not have any smokeless tobacco history on file. His alcohol and drug histories are not on file.  No Known Allergies  Family history: unable to assess since patient is nonverbal   Prior to Admission medications   Medication Sig Start Date End Date Taking? Authorizing Provider  acetaminophen (TYLENOL) 500 MG tablet Take 500 mg by mouth every 4 (four) hours as needed for mild pain or fever.   Yes Historical Provider, MD  alum & mag hydroxide-simeth (MAALOX/MYLANTA) 200-200-20 MG/5ML suspension Take 30 mLs by mouth every 6 (six) hours as needed for indigestion or heartburn.   Yes Historical Provider, MD  amLODipine (NORVASC) 10 MG tablet Take 10 mg by mouth daily.   Yes Historical Provider, MD  aspirin 81 MG tablet Take 81 mg by mouth daily.   Yes Historical Provider, MD  ferrous sulfate 325 (65 FE) MG tablet Take 325 mg by mouth daily with breakfast.    Yes Historical Provider, MD  finasteride (PROSCAR) 5 MG tablet Take 5 mg by mouth daily.   Yes Historical Provider, MD  furosemide (LASIX) 20 MG tablet Take 1 tablet (20 mg total) by mouth daily. 10/31/14  Yes Jerald KiefStephen K Chiu, MD  guaifenesin (ROBITUSSIN) 100 MG/5ML syrup Take 200 mg by mouth 4 (four) times daily as needed for cough.   Yes Historical Provider, MD  hydrALAZINE (APRESOLINE) 25 MG tablet Take 25 mg by mouth 3 (three) times daily.   Yes Historical Provider, MD  loperamide (IMODIUM) 2 MG capsule Take 2 mg by mouth as needed for diarrhea or loose stools.   Yes Historical Provider, MD  magnesium hydroxide (MILK OF MAGNESIA) 400 MG/5ML suspension Take 30 mLs by mouth at bedtime as needed for mild constipation.   Yes Historical Provider, MD  neomycin-bacitracin-polymyxin (NEOSPORIN) [phone] ointment Apply 1 application topically daily as needed. Use for skin tears, abrasions or minor irritations   Yes Historical Provider, MD  oxyCODONE (OXY IR/ROXICODONE) 5 MG immediate release tablet Take 1 tablet (5 mg total) by mouth every 4 (four) hours as needed for moderate pain. 10/31/14   Jerald KiefStephen K Chiu, MD   Physical Exam: Filed Vitals:   11/05/14 0905 11/05/14 1047 11/05/14 1201 11/05/14 1310  BP: 134/67 128/80 123/81 119/81  Pulse: 90 103 93 92  Temp: 98.1 F (36.7 C) 97.6 F (36.4 C) 98.1 F (36.7 C) 98.2 F (36.8 C)  TempSrc: Oral Oral Oral Oral  Resp: 16 16  16 16  Height:      Weight:      SpO2: 100% 99% 100% 99%    Wt Readings from Last 3 Encounters:  11/05/14 86.183 kg (190 lb)  10/28/14 88.27 kg (194 lb 9.6 oz)    General:  Appears calm and comfortable, does not answer questions Eyes: PERRL, normal lids, irises & conjunctiva ENT: grossly normal hearing, lips & tongue Neck: no LAD, masses or thyromegaly Cardiovascular: RRR, no m/r/g. trace LE edema. Telemetry: SR, no arrhythmias  Respiratory: CTA bilaterally, no w/r/r. Normal respiratory effort. Abdomen: soft,  ntnd Skin: no rash or induration seen on limited exam Musculoskeletal: grossly normal tone BUE/BLE Psychiatric: grossly normal mood and affect, speech fluent and appropriate Neurologic: grossly non-focal.          Labs on Admission:  Basic Metabolic Panel:  Recent Labs Lab 10/30/14 0645 10/31/14 0542 11/05/14 0840  NA 143 140 143  K 4.3 4.3 4.1  CL 110 107 110  CO2 22 21 19   GLUCOSE 112* 93 129*  BUN 57* 45* 60*  CREATININE 2.43* 2.20* 2.80*  CALCIUM 8.7 8.8 9.3   Liver Function Tests:  Recent Labs Lab 11/05/14 0840  AST 12  ALT 14  ALKPHOS 92  BILITOT 0.3  PROT 8.3  ALBUMIN 3.2*   No results for input(s): LIPASE, AMYLASE in the last 168 hours. No results for input(s): AMMONIA in the last 168 hours. CBC:  Recent Labs Lab 10/30/14 0645 10/31/14 0542 11/05/14 0840  WBC 6.3 7.8 6.5  NEUTROABS  --   --  5.0  HGB 10.3* 10.6* 10.8*  HCT 32.1* 32.8* 33.6*  MCV 90.2 88.4 89.4  PLT 179 201 260   Cardiac Enzymes: No results for input(s): CKTOTAL, CKMB, CKMBINDEX, TROPONINI in the last 168 hours.  BNP (last 3 results) No results for input(s): PROBNP in the last 8760 hours. CBG: No results for input(s): GLUCAP in the last 168 hours.  Radiological Exams on Admission: Dg Chest Port 1 View  11/05/2014   CLINICAL DATA:  Rectal bleeding  EXAM: PORTABLE CHEST - 1 VIEW  COMPARISON:  10/28/2014  FINDINGS: Moderate cardiomegaly. Bibasilar airspace disease versus atelectasis is stable. No pneumothorax. No pleural effusion. Normal vascularity.  IMPRESSION: Cardiomegaly without decompensation.  Stable bibasilar atelectasis versus airspace disease.   Electronically Signed   By: Maryclare BeanArt  Hoss M.D.   On: 11/05/2014 09:06    EKG: Independently reviewed. LBBB  Assessment/Plan Principal Problem:   Rectal bleed Active Problems:   Dementia   BPH (benign prostatic hyperplasia)   Normochromic normocytic anemia   CKD (chronic kidney disease)   1. Rectal bleeding. Patient is not  on any anticoagulation. Review his medication list does not indicate any nonsteroidal anti-inflammatories. He is on low-dose aspirin which will be held for now. Since he was Hemoccult positive and appeared to have witnessed bleeding, he'll be monitored in the hospital. We'll follow his CBC. He does not require blood transfusion at this point. We'll start him on proton pump inhibitors. Request GI consultation in a.m. to see if he is a candidate for EGD/colonoscopy. 2. Normocytic anemia. Likely anemia of chronic disease. Continue to follow. 3. Chronic kidney disease. Recent admission for acute renal failure with likely element of chronic kidney disease. Creatinine appears to be near levels during his previous admission. We will continue to follow. 4. Dementia. Patient does not provide any significant history.   Code Status: full code DVT Prophylaxis:scd Family Communication: no family present Disposition Plan: discharge back to  ALF when stable  Time spent:  [facility] Pager [phone]

## 2014-11-05 NOTE — ED Provider Notes (Addendum)
CSN: [id number]     Arrival date & time 11/05/14  [id number] History  This chart was scribed for Hilario Quarryanielle S Deannie Resetar, MD by Modena JanskyAlbert Thayil, ED Scribe. This patient was seen in room APA18/APA18 and the patient's care was started at 8:28 AM.   Chief Complaint  Patient presents with  . Rectal Bleeding   Level 5 Caveat due to dementia The history is provided by the EMS personnel. No language interpreter was used.   HPI Comments: Daniel Holt is a 80 y.o. male who presents to the Emergency Department complaining of possible rectal bleeding that started today. EMS states that he was found in a pool of blood and it was assumed to be from his rectum.   Past Medical History  Diagnosis Date  . Hypertension   . Dementia   . Liver mass   . BPH (benign prostatic hyperplasia)   . Cardiomyopathy    History reviewed. No pertinent past surgical history. History reviewed. No pertinent family history. History  Substance Use Topics  . Smoking status: Never Smoker   . Smokeless tobacco: Not on file     Comment: difficult to assess d/t pt.'s cognitive status   . Alcohol Use: Not on file    Review of Systems  Unable to perform ROS: Dementia  Gastrointestinal: Positive for hematochezia.    Allergies  Review of patient's allergies indicates no known allergies.  Home Medications   Prior to Admission medications   Medication Sig Start Date End Date Taking? Authorizing Provider  acetaminophen (TYLENOL) 500 MG tablet Take 500 mg by mouth every 4 (four) hours as needed for mild pain or fever.   Yes Historical Provider, MD  alum & mag hydroxide-simeth (MAALOX/MYLANTA) 200-200-20 MG/5ML suspension Take 30 mLs by mouth every 6 (six) hours as needed for indigestion or heartburn.   Yes Historical Provider, MD  amLODipine (NORVASC) 10 MG tablet Take 10 mg by mouth daily.   Yes Historical Provider, MD  aspirin 81 MG tablet Take 81 mg by mouth daily.   Yes Historical Provider, MD  ferrous sulfate 325 (65 FE) MG tablet  Take 325 mg by mouth daily with breakfast.   Yes Historical Provider, MD  finasteride (PROSCAR) 5 MG tablet Take 5 mg by mouth daily.   Yes Historical Provider, MD  furosemide (LASIX) 20 MG tablet Take 1 tablet (20 mg total) by mouth daily. 10/31/14  Yes Jerald KiefStephen K Chiu, MD  guaifenesin (ROBITUSSIN) 100 MG/5ML syrup Take 200 mg by mouth 4 (four) times daily as needed for cough.   Yes Historical Provider, MD  hydrALAZINE (APRESOLINE) 25 MG tablet Take 25 mg by mouth 3 (three) times daily.   Yes Historical Provider, MD  loperamide (IMODIUM) 2 MG capsule Take 2 mg by mouth as needed for diarrhea or loose stools.   Yes Historical Provider, MD  magnesium hydroxide (MILK OF MAGNESIA) 400 MG/5ML suspension Take 30 mLs by mouth at bedtime as needed for mild constipation.   Yes Historical Provider, MD  neomycin-bacitracin-polymyxin (NEOSPORIN) [phone] ointment Apply 1 application topically daily as needed. Use for skin tears, abrasions or minor irritations   Yes Historical Provider, MD  oxyCODONE (OXY IR/ROXICODONE) 5 MG immediate release tablet Take 1 tablet (5 mg total) by mouth every 4 (four) hours as needed for moderate pain. 10/31/14   Jerald KiefStephen K Chiu, MD   BP 145/76 mmHg  Pulse 96  Temp(Src) 96.4 F (35.8 C) (Rectal)  Resp 16  Ht 5\' 9"  (1.753 m)  Wt 190 lb (86.183  kg)  BMI 28.05 kg/m2  SpO2 100% Physical Exam  Constitutional: He appears well-developed.  HENT:  Head: Normocephalic and atraumatic.  Right Ear: External ear normal.  Left Ear: External ear normal.  Nose: Nose normal.  Mouth/Throat: Oropharynx is clear and moist.  Eyes: Conjunctivae are normal. Pupils are equal, round, and reactive to light.  Neck: Normal range of motion.  Cardiovascular: Normal rate.   Pulmonary/Chest: Effort normal.  Abdominal: Soft. There is no tenderness.  Genitourinary: Guaiac positive stool.  Musculoskeletal: Normal range of motion.  Neurological: He is alert.  Skin: Skin is warm and dry.  Nursing  note and vitals reviewed.   ED Course  Procedures (including critical care time) DIAGNOSTIC STUDIES: Oxygen Saturation is 100% on RA, normal by my interpretation.    COORDINATION OF CARE: 8:32 AM- Pt advised of plan for treatment and pt agrees.  Labs Review Labs Reviewed  COMPREHENSIVE METABOLIC PANEL - Abnormal; Notable for the following:    Glucose, Bld 129 (*)    BUN 60 (*)    Creatinine, Ser 2.80 (*)    Albumin 3.2 (*)    GFR calc non Af Amer 19 (*)    GFR calc Af Amer 22 (*)    All other components within normal limits  CBC WITH DIFFERENTIAL - Abnormal; Notable for the following:    RBC 3.76 (*)    Hemoglobin 10.8 (*)    HCT 33.6 (*)    All other components within normal limits  POC OCCULT BLOOD, ED - Abnormal; Notable for the following:    Fecal Occult Bld POSITIVE (*)    All other components within normal limits  PROTIME-INR  TYPE AND SCREEN    Imaging Review Dg Chest Port 1 View  11/05/2014   CLINICAL DATA:  Rectal bleeding  EXAM: PORTABLE CHEST - 1 VIEW  COMPARISON:  10/28/2014  FINDINGS: Moderate cardiomegaly. Bibasilar airspace disease versus atelectasis is stable. No pneumothorax. No pleural effusion. Normal vascularity.  IMPRESSION: Cardiomegaly without decompensation.  Stable bibasilar atelectasis versus airspace disease.   Electronically Signed   By: Maryclare BeanArt  Hoss M.D.   On: 11/05/2014 09:06     EKG Interpretation None      Date: 11/05/2014  Rate: 86  Rhythm: normal sinus rhythm  QRS Axis: left  Intervals: PR prolonged  ST/T Wave abnormalities: nonspecific ST/T changes  Conduction Disutrbances:left bundle branch block  Narrative Interpretation:   Old EKG Reviewed: none available   MDM   Final diagnoses:  Rectal bleeding   This is an 80 year old male who was recently discharged from this facility for altered mental status. He has dementia and is  unable to give any history. Report from the facility he is out of that he was found in a pool of blood.  On my exam I did not find any obvious source of bleeding. Hemoccult is positive. He appears hemodynamically stable here. Plan is observation to continue Hemoccult sure no further bleeding.   1 report of rectal bleeding/heme positive stool observation for further assessment. Patient's hemoglobin appears stable from previous at 10.8 and he appears to be hemodynamically stable, but is slightly tachycardic. 2 dementia appears baseline 3 renal failure creatinine today is 2.8 which is trending down from first creatinine in our system of 4 but we do not have a long history of creatinines on this patient. 4 abnormal chest x-Allyne Hebert which appear stable from first prior.  Discussed with Dr. Kerry HoughMemon and planned observation in MedSurg bed.  I personally performed the services  described in this documentation, which was scribed in my presence. The recorded information has been reviewed and considered.   Hilario Quarry, MD 11/05/14 1610  Hilario Quarry, MD 11/15/14 [phone]

## 2014-11-05 NOTE — Progress Notes (Signed)
Pt refused to wear SCD's & too confused to understand.

## 2014-11-05 NOTE — ED Notes (Signed)
Patient unable to stand for orthostatic VS 

## 2014-11-05 NOTE — Plan of Care (Signed)
Problem: Phase II Progression Outcomes Goal: Hemodynamically stable Outcome: Completed/Met Date Met:  11/05/14

## 2014-11-06 ENCOUNTER — Encounter (HOSPITAL_COMMUNITY): Payer: Self-pay | Admitting: Gastroenterology

## 2014-11-06 DIAGNOSIS — I10 Essential (primary) hypertension: Secondary | ICD-10-CM

## 2014-11-06 DIAGNOSIS — K625 Hemorrhage of anus and rectum: Secondary | ICD-10-CM | POA: Diagnosis not present

## 2014-11-06 DIAGNOSIS — N189 Chronic kidney disease, unspecified: Secondary | ICD-10-CM

## 2014-11-06 LAB — CBC
HCT: 31.1 % — ABNORMAL LOW (ref 39.0–52.0)
HEMOGLOBIN: 10.1 g/dL — AB (ref 13.0–17.0)
MCH: 28.9 pg (ref 26.0–34.0)
MCHC: 32.5 g/dL (ref 30.0–36.0)
MCV: 88.9 fL (ref 78.0–100.0)
Platelets: 230 10*3/uL (ref 150–400)
RBC: 3.5 MIL/uL — ABNORMAL LOW (ref 4.22–5.81)
RDW: 14 % (ref 11.5–15.5)
WBC: 6 10*3/uL (ref 4.0–10.5)

## 2014-11-06 LAB — BASIC METABOLIC PANEL
ANION GAP: 13 (ref 5–15)
BUN: 40 mg/dL — ABNORMAL HIGH (ref 6–23)
CO2: 19 mEq/L (ref 19–32)
Calcium: 9 mg/dL (ref 8.4–10.5)
Chloride: 114 mEq/L — ABNORMAL HIGH (ref 96–112)
Creatinine, Ser: 2.29 mg/dL — ABNORMAL HIGH (ref 0.50–1.35)
GFR calc Af Amer: 28 mL/min — ABNORMAL LOW (ref >90)
GFR, EST NON AFRICAN AMERICAN: 24 mL/min — AB (ref >90)
GLUCOSE: 133 mg/dL — AB (ref 70–99)
POTASSIUM: 4.2 meq/L (ref 3.7–5.3)
Sodium: 146 mEq/L (ref 137–147)

## 2014-11-06 MED ORDER — HYDROCORTISONE ACETATE 25 MG RE SUPP
25.0000 mg | Freq: Two times a day (BID) | RECTAL | Status: DC
  Administered 2014-11-06 – 2014-11-07 (×3): 25 mg via RECTAL
  Filled 2014-11-06 (×5): qty 1

## 2014-11-06 NOTE — Clinical Social Work Psychosocial (Signed)
Clinical Social Work Department BRIEF PSYCHOSOCIAL ASSESSMENT 11/06/2014  Patient:  Daniel Holt,Daniel Holt     Account Number:  [id number]     Admit date:  11/05/2014  Clinical Social Worker:  Nancie NeasSTULTZ,Adelis Docter, LCSW Date/Time:  11/06/2014 10:10 AM  Referred by:  CSW  Date Referred:  11/06/2014 Referred for  ALF Placement   Other Referral:   Interview type:  Family Other interview type:   son- Daniel Holt    PSYCHOSOCIAL DATA Living Status:  FACILITY Admitted from facility:  [facility] Level of care:  Assisted Living Primary support name:  Daniel Holt Primary support relationship to patient:  CHILD, ADULT Degree of support available:   adequate    CURRENT CONCERNS Current Concerns  Post-Acute Placement   Other Concerns:    SOCIAL WORK ASSESSMENT / PLAN CSW spoke with pt's son Daniel Holt ([phone], [phone]) on phone as pt is oriented to self only. Pt known to CSW from admission a few weeks ago. He has been a [facility] for [facility] years and is on memory care unit. Daniel Holt has been unable to visit as regularly as he would like due to personal health issues, but states that these have improved so he is hopeful to see pt at least weekly. Per Crystal at facility, pt had returned to close to baseline function, but was definitely more lethargic. They found him yesterday laying in blood and sent pt to ED for evaluation. No home health prior to admission. Okay for return. Pt requires assist with bathing, dressing, and toileting. He ambulates unassisted.   Assessment/plan status:  Psychosocial Support/Ongoing Assessment of Needs Other assessment/ plan:   Information/referral to [facility] resources:   [facility]    PATIENT'S/FAMILY'S RESPONSE TO PLAN OF CARE: Pt unable to discuss plan of care due to dementia. Pt's son reports positive feelings regarding [facility] d[facility]. CSW will continue to follow.       Daniel Holt, [place] [phone]

## 2014-11-06 NOTE — Plan of Care (Signed)
Problem: Phase I Progression Outcomes Goal: Voiding-avoid urinary catheter unless indicated Outcome: Completed/Met Date Met:  11/06/14     

## 2014-11-06 NOTE — Consult Note (Signed)
Referring Provider: Dr. Kerry HoughMemon Primary Care Physician:  No primary care provider on file. Primary Gastroenterologist:  Dr. Jena Gaussourk   Date of Admission: 11/05/14 Date of Consultation: 11/06/14  Reason for Consultation:  Rectal bleeding  HPI:  Daniel Holt is an 80 year old male admitted from [facility] in [place], [place], after being found in a "pool of blood". History of dementia and does not respond to my questions but is alert. Information obtained via ED notes, H&P, and speaking with son, Daniel Holt, who is a vague historian. Heme positive on arrival to ED but without obvious etiology for rectal bleeding per ED notes. Hgb without significant change, similar to values from previous admission. Per son, he has a colonoscopy "every 6 months" and has had rectal bleeding before; however, the son is not able to tell me the timeline of when this occurred or the date of the last colonoscopy. [facility]. There is mention of a liver mass in past medical history but son denies any liver issues. Per nursing staff, no evidence of rectal bleeding since admission. [facility] to [facility] this morning, but I was unable to reach the primary nurse.     Past Medical History  Diagnosis Date  . Hypertension   . Dementia   . Liver mass   . BPH (benign prostatic hyperplasia)   . Cardiomyopathy     History reviewed. No pertinent past surgical history.  Prior to Admission medications   Medication Sig Start Date End Date Taking? Authorizing Provider  acetaminophen (TYLENOL) 500 MG tablet Take 500 mg by mouth every 4 (four) hours as needed for mild pain or fever.   Yes Historical Provider, MD  alum & mag hydroxide-simeth (MAALOX/MYLANTA) 200-200-20 MG/5ML suspension Take 30 mLs by mouth every 6 (six) hours as needed for indigestion or heartburn.   Yes Historical Provider, MD  amLODipine (NORVASC) 10 MG tablet Take 10 mg by mouth daily.   Yes  Historical Provider, MD  aspirin 81 MG tablet Take 81 mg by mouth daily.   Yes Historical Provider, MD  ferrous sulfate 325 (65 FE) MG tablet Take 325 mg by mouth daily with breakfast.   Yes Historical Provider, MD  finasteride (PROSCAR) 5 MG tablet Take 5 mg by mouth daily.   Yes Historical Provider, MD  furosemide (LASIX) 20 MG tablet Take 1 tablet (20 mg total) by mouth daily. 10/31/14  Yes Jerald KiefStephen K Chiu, MD  guaifenesin (ROBITUSSIN) 100 MG/5ML syrup Take 200 mg by mouth 4 (four) times daily as needed for cough.   Yes Historical Provider, MD  hydrALAZINE (APRESOLINE) 25 MG tablet Take 25 mg by mouth 3 (three) times daily.   Yes Historical Provider, MD  loperamide (IMODIUM) 2 MG capsule Take 2 mg by mouth as needed for diarrhea or loose stools.   Yes Historical Provider, MD  magnesium hydroxide (MILK OF MAGNESIA) 400 MG/5ML suspension Take 30 mLs by mouth at bedtime as needed for mild constipation.   Yes Historical Provider, MD  neomycin-bacitracin-polymyxin (NEOSPORIN) [phone] ointment Apply 1 application topically daily as needed. Use for skin tears, abrasions or minor irritations   Yes Historical Provider, MD  oxyCODONE (OXY IR/ROXICODONE) 5 MG immediate release tablet Take 1 tablet (5 mg total) by mouth every 4 (four) hours as needed for moderate pain. 10/31/14   Jerald KiefStephen K Chiu, MD    Current Facility-Administered Medications  Medication Dose Route Frequency Provider Last Rate Last Dose  . 0.9 %  sodium  chloride infusion   Intravenous Continuous Erick BlinksJehanzeb Memon, MD 75 mL/hr at 11/05/14 1844    . acetaminophen (TYLENOL) tablet 650 mg  650 mg Oral Q6H PRN Erick BlinksJehanzeb Memon, MD       Or  . acetaminophen (TYLENOL) suppository 650 mg  650 mg Rectal Q6H PRN Erick BlinksJehanzeb Memon, MD      . amLODipine (NORVASC) tablet 10 mg  10 mg Oral Daily Erick BlinksJehanzeb Memon, MD   10 mg at 11/05/14 1845  . finasteride (PROSCAR) tablet 5 mg  5 mg Oral Daily Erick BlinksJehanzeb Memon, MD   5 mg at 11/05/14 1854  . haloperidol lactate  (HALDOL) injection 2 mg  2 mg Intravenous Q6H PRN Erick BlinksJehanzeb Memon, MD   2 mg at 11/06/14 [id number]  . hydrALAZINE (APRESOLINE) tablet 25 mg  25 mg Oral TID Erick BlinksJehanzeb Memon, MD   25 mg at 11/05/14 2104  . ondansetron (ZOFRAN) tablet 4 mg  4 mg Oral Q6H PRN Erick BlinksJehanzeb Memon, MD       Or  . ondansetron (ZOFRAN) injection 4 mg  4 mg Intravenous Q6H PRN Erick BlinksJehanzeb Memon, MD      . oxyCODONE (Oxy IR/ROXICODONE) immediate release tablet 5 mg  5 mg Oral Q4H PRN Erick BlinksJehanzeb Memon, MD      . pantoprazole (PROTONIX) injection 40 mg  40 mg Intravenous Q12H Erick BlinksJehanzeb Memon, MD   40 mg at 11/05/14 2104    Allergies as of 11/05/2014  . (No Known Allergies)    Family History  Problem Relation Age of Onset  . Colon cancer Neg Hx     History   Social History  . Marital Status: Single    Spouse Name: N/A    Number of Children: N/A  . Years of Education: N/A   Occupational History  . Not on file.   Social History Main Topics  . Smoking status: Never Smoker   . Smokeless tobacco: Not on file     Comment: difficult to assess d/t pt.'s cognitive status; son states no smoking in 30-40 years  . Alcohol Use: No     Comment: none in 30-40 years  . Drug Use: Not on file  . Sexual Activity: Not on file   Other Topics Concern  . Not on file   Social History Narrative    Review of Systems: Unable to obtain due to cognitive status.   Physical Exam: Vital signs in last 24 hours: Temp:  [97.5 F (36.4 C)-98.2 F (36.8 C)] 97.5 F (36.4 C) (12/07 0636) Pulse Rate:  [84-103] 89 (12/07 0636) Resp:  [16-20] 20 (12/07 0636) BP: (119-140)/(65-81) 138/65 mmHg (12/07 0636) SpO2:  [99 %-100 %] 100 % (12/06 2107) Last BM Date:  (unknown) General:   Alert,  Appears stated age. Unable to assess orientation. Patient non-verbal on exam today.  Head:  Normocephalic and atraumatic. Eyes:  Sclera clear, no icterus.    Ears:  Unable to assess auditory acuity Nose:  No deformity, discharge,  or lesions. Mouth:  Refuses  to open mouth Lungs:  Clear throughout to auscultation.   No wheezes, crackles, or rhonchi. No acute distress. Heart:  S1 S2 present without appreciable murmur Abdomen:  Soft, nontender and obese. Normal bowel sounds. RUQ with marked fullness Rectal:  Performed in ED on admission Msk:  Symmetrical without gross deformities.  Pulses:  Normal pulses noted. Extremities:  Trace bilateral lower extremity edema Neurologic:  Alert, unable to assess orientation Skin:  Intact without significant lesions or rashes.   Intake/Output from previous day:  12/06 0701 - 12/07 0700 In: -  Out: 1000 [Urine:1000] Intake/Output this shift:    Lab Results:  Recent Labs  11/05/14 0840 11/06/14 0719  WBC 6.5 6.0  HGB 10.8* 10.1*  HCT 33.6* 31.1*  PLT 260 230   BMET  Recent Labs  11/05/14 0840 11/06/14 0719  NA 143 146  K 4.1 4.2  CL 110 114*  CO2 19 19  GLUCOSE 129* 133*  BUN 60* 40*  CREATININE 2.80* 2.29*  CALCIUM 9.3 9.0   LFT  Recent Labs  11/05/14 0840  PROT 8.3  ALBUMIN 3.2*  AST 12  ALT 14  ALKPHOS 92  BILITOT 0.3   PT/INR  Recent Labs  11/05/14 0924  LABPROT 14.9  INR 1.16   Studies/Results: Dg Chest Port 1 View  11/05/2014   CLINICAL DATA:  Rectal bleeding  EXAM: PORTABLE CHEST - 1 VIEW  COMPARISON:  10/28/2014  FINDINGS: Moderate cardiomegaly. Bibasilar airspace disease versus atelectasis is stable. No pneumothorax. No pleural effusion. Normal vascularity.  IMPRESSION: Cardiomegaly without decompensation.  Stable bibasilar atelectasis versus airspace disease.   Electronically Signed   By: Maryclare Bean M.D.   On: 11/05/2014 09:06    Impression: 80 year old male with history of dementia, admitted after being found in a "pool of blood" from assisted living facility; Hgb remaining stable from baseline without any evidence for further GI bleeding. History limited due to patient's cognitive status and vague history from son. Attempted to call [facility] for further  information but unable to speak with primary nurse at time of consultation. Interestingly, per son, patient has undergone multiple colonoscopies in the past in [place], [place]. Timing of last colonoscopy unknown, and son is a vague historian. There is also some question of a liver mass per review of past medical records, but son denies any known liver issues.   As there is no further rectal bleeding, will treat with anusol suppositories and monitor for any further overt GI bleeding or drop in H/H. Will obtain most recent colonoscopy reports from [facility] if possible. May be benign etiology. Doubtful that patient would be cooperative with drinking prep. Recommend supportive measures at this point, reserving a colonoscopy for any evidence of persistent bleeding or acute blood loss anemia.   Plan: Anusol suppositories BID Full liquid diet PPI for GI prophylaxis Request colonoscopy reports from [facility] CBC tomorrow morning Monitor for any further overt GI bleeding Supportive measures unless clinical status changes   Nira Retort, ANP-BC [facility]    LOS: 1 day    11/06/2014, 9:29 AM    Attending note:  Patient seen and examined. Agree with assessment and impression as outlined above. No need for any urgent endoscopic intervention at this time.  Would like to review last colonoscopy report. He should follow-up with his [facility] gastroenterologist after discharge.

## 2014-11-06 NOTE — Plan of Care (Signed)
Problem: Phase II Progression Outcomes Goal: H&H stablized < 1gm drop in 24 hrs Outcome: Completed/Met Date Met:  11/06/14

## 2014-11-06 NOTE — Progress Notes (Signed)
[facility] PROGRESS NOTE  Daniel LinkWilliam Holt FAO:[id number] DOB: 04/10/1926 DOA: 11/05/2014 PCP: No primary care provider on file.  Assessment/Plan: 1. Rectal bleeding. Appreciate GI systems. Records from [place] of recent colonoscopy requested. Has been started on Anusol suppositories. Continue to monitor for now. Repeat CBC in a.m. Currently hemoglobin appears stable. 2. Thoracic anemia. Likely anemia of chronic disease. Continue to follow. 3. Chronic kidney disease. Creatinine improved with hydration. Near his baseline creatinine. Continue to follow. 4. Dementia. 5. Hypertension. Continue hydralazine and amlodipine. Blood pressure stable.  Code Status: Full code Family Communication: No family present Disposition Plan: Return to ALF   Consultants:  Gastroenterology  Procedures:    Antibiotics:    HPI/Subjective: Denies any complaints.  Objective: Filed Vitals:   11/06/14 0636  BP: 138/65  Pulse: 89  Temp: 97.5 F (36.4 C)  Resp: 20    Intake/Output Summary (Last 24 hours) at 11/06/14 1956 Last data filed at 11/06/14 1800  Gross per 24 hour  Intake   1865 ml  Output   1450 ml  Net    415 ml   Filed Weights   11/05/14 0824  Weight: 86.183 kg (190 lb)    Exam:   General:  No acute distress  Cardiovascular: S1, S2, regular rate and rhythm  Respiratory: Clear to auscultation bilaterally  Abdomen: Soft, nontender, positive bowel sounds  Musculoskeletal: 1+ bilateral lower extremity edema   Data Reviewed: Basic Metabolic Panel:  Recent Labs Lab 10/31/14 0542 11/05/14 0840 11/06/14 0719  NA 140 143 146  K 4.3 4.1 4.2  CL 107 110 114*  CO2 21 19 19   GLUCOSE 93 129* 133*  BUN 45* 60* 40*  CREATININE 2.20* 2.80* 2.29*  CALCIUM 8.8 9.3 9.0   Liver Function Tests:  Recent Labs Lab 11/05/14 0840  AST 12  ALT 14  ALKPHOS 92  BILITOT 0.3  PROT 8.3  ALBUMIN 3.2*   No results for input(s): LIPASE, AMYLASE in the last 168 hours. No  results for input(s): AMMONIA in the last 168 hours. CBC:  Recent Labs Lab 10/31/14 0542 11/05/14 0840 11/06/14 0719  WBC 7.8 6.5 6.0  NEUTROABS  --  5.0  --   HGB 10.6* 10.8* 10.1*  HCT 32.8* 33.6* 31.1*  MCV 88.4 89.4 88.9  PLT 201 260 230   Cardiac Enzymes: No results for input(s): CKTOTAL, CKMB, CKMBINDEX, TROPONINI in the last 168 hours. BNP (last 3 results) No results for input(s): PROBNP in the last 8760 hours. CBG: No results for input(s): GLUCAP in the last 168 hours.  Recent Results (from the past 240 hour(s))  MRSA PCR Screening     Status: None   Collection Time: 10/28/14  3:40 AM  Result Value Ref Range Status   MRSA by PCR NEGATIVE NEGATIVE Final    Comment:        The GeneXpert MRSA Assay (FDA approved for NASAL specimens only), is one component of a comprehensive MRSA colonization surveillance program. It is not intended to diagnose MRSA infection nor to guide or monitor treatment for MRSA infections.      Studies: Dg Chest Port 1 View  11/05/2014   CLINICAL DATA:  Rectal bleeding  EXAM: PORTABLE CHEST - 1 VIEW  COMPARISON:  10/28/2014  FINDINGS: Moderate cardiomegaly. Bibasilar airspace disease versus atelectasis is stable. No pneumothorax. No pleural effusion. Normal vascularity.  IMPRESSION: Cardiomegaly without decompensation.  Stable bibasilar atelectasis versus airspace disease.   Electronically Signed   By: Maryclare BeanArt  Hoss M.D.   On:  11/05/2014 09:06    Scheduled Meds: . amLODipine  10 mg Oral Daily  . finasteride  5 mg Oral Daily  . hydrALAZINE  25 mg Oral TID  . hydrocortisone  25 mg Rectal BID  . pantoprazole (PROTONIX) IV  40 mg Intravenous Q12H   Continuous Infusions: . sodium chloride 75 mL/hr at 11/05/14 1844    Principal Problem:   Rectal bleed Active Problems:   Dementia   BPH (benign prostatic hyperplasia)   Normochromic normocytic anemia   CKD (chronic kidney disease)    Time spent: 25mins    [facility],[name]  [facility] Pager [phone]. If 7PM-7AM, please contact night-coverage at [email or web address], password [email or web address] 11/06/2014, 7:56 PM  LOS: 1 day

## 2014-11-06 NOTE — Care Management Note (Signed)
    Page 1 of 1   11/06/2014     9:13:11 AM CARE MANAGEMENT NOTE 11/06/2014  Patient:  Daniel Holt,Daniel   Account Number:  [id number]  Date Initiated:  11/06/2014  Documentation initiated by:  Kathyrn SheriffHILDRESS,JESSICA  Subjective/Objective Assessment:   Pt admitted with gi bleed. Pt is from [facility] and is non-verbal.     Action/Plan:   Pt plans to discharge back to ALF. CSW aware of discharge plan and will arrange for return to facility. No CM needs identified at this time.   Anticipated DC Date:  11/07/2014   Anticipated DC Plan:  ASSISTED LIVING / REST HOME  In-house referral  Clinical Social Worker      DC Planning Services  CM consult      Choice offered to / List presented to:             Status of service:  Completed, signed off Medicare Important Message given?   (If response is "NO", the following Medicare IM given date fields will be blank) Date Medicare IM given:   Medicare IM given by:   Date Additional Medicare IM given:   Additional Medicare IM given by:    Discharge Disposition:  ASSISTED LIVING  Per UR Regulation:    If discussed at Long Length of Stay Meetings, dates discussed:    Comments:  11/06/2014 0900 Kathyrn SheriffJessica Childress, RN, MSN, [facility]

## 2014-11-06 NOTE — Progress Notes (Signed)
UR completed 

## 2014-11-07 DIAGNOSIS — K625 Hemorrhage of anus and rectum: Secondary | ICD-10-CM | POA: Diagnosis not present

## 2014-11-07 DIAGNOSIS — N184 Chronic kidney disease, stage 4 (severe): Secondary | ICD-10-CM

## 2014-11-07 LAB — CBC
HCT: 32.4 % — ABNORMAL LOW (ref 39.0–52.0)
Hemoglobin: 10.3 g/dL — ABNORMAL LOW (ref 13.0–17.0)
MCH: 28.1 pg (ref 26.0–34.0)
MCHC: 31.8 g/dL (ref 30.0–36.0)
MCV: 88.5 fL (ref 78.0–100.0)
PLATELETS: 277 10*3/uL (ref 150–400)
RBC: 3.66 MIL/uL — AB (ref 4.22–5.81)
RDW: 13.9 % (ref 11.5–15.5)
WBC: 6.4 10*3/uL (ref 4.0–10.5)

## 2014-11-07 MED ORDER — HYDROCORTISONE ACETATE 25 MG RE SUPP: 25.0000 mg | Freq: Two times a day (BID) | RECTAL | Status: AC

## 2014-11-07 NOTE — Progress Notes (Signed)
[facility].  Report and patient education given to Liz ClaiborneHeather Linthicum at [facility].  Heather verbalized understanding with no questions, complaints or concerns voiced at this time.  [facility].  IV was removed with catheter intact, no bleeding or complications.  Patient left unit in stable condition with Heather [facility] by staff member in a wheelchair.

## 2014-11-07 NOTE — Progress Notes (Signed)
    Subjective: Non-verbal. No overt GI bleeding per nursing staff.   Objective: Vital signs in last 24 hours: Temp:  [98.2 F (36.8 C)-98.5 F (36.9 C)] 98.5 F (36.9 C) (12/08 0606) Pulse Rate:  [82-88] 82 (12/08 0606) Resp:  [18-20] 18 (12/08 0606) BP: (132-140)/(57-72) 140/57 mmHg (12/08 0606) SpO2:  [99 %-100 %] 99 % (12/08 0606) Last BM Date:  (unknown) General:   Alert, unable to assess orientation.  Head:  Normocephalic and atraumatic. Abdomen:  Bowel sounds present, soft, obese, some right-sided fullness, no TTP Extremities:  1+ lower extremity edema. Hands in mitts for restraitns Neurologic:  Alert to person only, non-verbal   Intake/Output from previous day: 12/07 0701 - 12/08 0700 In: 1985 [P.O.:240; I.V.:1745] Out: 1150 [Urine:1150] Intake/Output this shift:    Lab Results:  Recent Labs  11/05/14 0840 11/06/14 0719 11/07/14 0559  WBC 6.5 6.0 6.4  HGB 10.8* 10.1* 10.3*  HCT 33.6* 31.1* 32.4*  PLT 260 230 277   BMET  Recent Labs  11/05/14 0840 11/06/14 0719  NA 143 146  K 4.1 4.2  CL 110 114*  CO2 19 19  GLUCOSE 129* 133*  BUN 60* 40*  CREATININE 2.80* 2.29*  CALCIUM 9.3 9.0   LFT  Recent Labs  11/05/14 0840  PROT 8.3  ALBUMIN 3.2*  AST 12  ALT 14  ALKPHOS 92  BILITOT 0.3   PT/INR  Recent Labs  11/05/14 0924  LABPROT 14.9  INR 1.16     Studies/Results: Dg Chest Port 1 View  11/05/2014   CLINICAL DATA:  Rectal bleeding  EXAM: PORTABLE CHEST - 1 VIEW  COMPARISON:  10/28/2014  FINDINGS: Moderate cardiomegaly. Bibasilar airspace disease versus atelectasis is stable. No pneumothorax. No pleural effusion. Normal vascularity.  IMPRESSION: Cardiomegaly without decompensation.  Stable bibasilar atelectasis versus airspace disease.   Electronically Signed   By: Maryclare BeanArt  Hoss M.D.   On: 11/05/2014 09:06    Assessment/Plan: 80 year old male with history of dementia, admitted after being found in a "pool of blood" from assisted living  facility; Hgb remaining stable from baseline without any evidence for further GI bleeding. Timing of last colonoscopy unknown, and son is a vague historian. There is also some question of a liver mass per review of past medical records, but son denies any known liver issues. Records requested yesterday and discussed with nurse, but it does not appear a release of information was sent. Will request again for our review.   As there is no further rectal bleeding and Hgb stable, appropriate for discharge home with anusol suppositories X 10 days. Follow[place]up with established gastroenterologist in [place].    Nira RetortAnna W. Sams, ANP-BC [facility]     LOS: 2 days    11/07/2014, 8:28 AM

## 2014-11-07 NOTE — Discharge Summary (Signed)
Physician Discharge Summary  Daniel Holt ZOX:[id number] DOB: Feb 13, 1926 DOA: 11/05/2014  PCP: No primary care provider on file.  Admit date: 11/05/2014 Discharge date: 11/07/2014  Time spent: 40 minutes  Recommendations for Outpatient Follow-up:  1. Follow up with primary GI provider in [place]. For tracking of Hg, evaluation of need for anusol supp and recommendations for resuming low dose ASA.  2. Recommend CBC 12/11 to track Hg 3. Discharge to [facility] assisted living.  4.   Discharge Diagnoses:  Principal Problem:   Rectal bleed Active Problems:   Dementia   BPH (benign prostatic hyperplasia)   Normochromic normocytic anemia   CKD (chronic kidney disease)   Discharge Condition: stable  Diet recommendation: heart healthy  Filed Weights   11/05/14 0824  Weight: 86.183 kg (190 lb)    History of present illness:  Daniel Holt is a 80 y.o. male who has dementia and was unable to provide any significant history on 11/05/14 when he presented to ED from facility with cc rectal bleeding. According to ER staff and notes, patient was at his assisted living facility when he was found in a "pool of blood". He was brought to the emergency room for evaluation where he was found to be Hemoccult positive. His hemoglobin appeared to be similar to previous level which was checked on 12/1 when he was last discharged. No further details were obtainable.   Hospital Course:  1. Rectal bleeding. No further episodes. Hg remained stable at what appears to be baseline.  Records from [place] of recent colonoscopy requested x 2 but not arrived at discharge. Provided with  Anusol suppositories. Evaluated by GI who agree with discharge. Recommending close OP follow up with primary GI in [place].  2. Normocytic anemia. Likely anemia of chronic disease. See #1. Recommend CBC 11/10/14 track Hg 3. Chronic kidney disease. Provided with IV fluids and at discharge creatinine at baseline.  4. Dementia.  Stable at baseline. 5. Hypertension. Controlled.   Procedures:  none  Consultations:  Dr Jena Gauss GI  Discharge Exam: Filed Vitals:   11/07/14 0606  BP: 140/57  Pulse: 82  Temp: 98.5 F (36.9 C)  Resp: 18    General: well nourished appears comfortable.  Cardiovascular: RRR no MGR trace LE edema Pedal pulses palpable Respiratory: normal effort BS clear bilaterally to auscultaiton  Discharge Instructions You were cared for by a hospitalist during your hospital stay. If you have any questions about your discharge medications or the care you received while you were in the hospital after you are discharged, you can call the unit and asked to speak with the hospitalist on call if the hospitalist that took care of you is not available. Once you are discharged, your primary care physician will handle any further medical issues. Please note that NO REFILLS for any discharge medications will be authorized once you are discharged, as it is imperative that you return to your primary care physician (or establish a relationship with a primary care physician if you do not have one) for your aftercare needs so that they can reassess your need for medications and monitor your lab values.   Current Discharge Medication List    START taking these medications   Details  hydrocortisone (ANUSOL-HC) 25 MG suppository Place 1 suppository (25 mg total) rectally 2 (two) times daily. Qty: 12 suppository, Refills: 0      CONTINUE these medications which have NOT CHANGED   Details  acetaminophen (TYLENOL) 500 MG tablet Take 500 mg by mouth every  4 (four) hours as needed for mild pain or fever.    alum & mag hydroxide-simeth (MAALOX/MYLANTA) 200-200-20 MG/5ML suspension Take 30 mLs by mouth every 6 (six) hours as needed for indigestion or heartburn.    amLODipine (NORVASC) 10 MG tablet Take 10 mg by mouth daily.    ferrous sulfate 325 (65 FE) MG tablet Take 325 mg by mouth daily with breakfast.     finasteride (PROSCAR) 5 MG tablet Take 5 mg by mouth daily.    furosemide (LASIX) 20 MG tablet Take 1 tablet (20 mg total) by mouth daily. Qty: 30 tablet, Refills: 0    guaifenesin (ROBITUSSIN) 100 MG/5ML syrup Take 200 mg by mouth 4 (four) times daily as needed for cough.    hydrALAZINE (APRESOLINE) 25 MG tablet Take 25 mg by mouth 3 (three) times daily.    loperamide (IMODIUM) 2 MG capsule Take 2 mg by mouth as needed for diarrhea or loose stools.    magnesium hydroxide (MILK OF MAGNESIA) 400 MG/5ML suspension Take 30 mLs by mouth at bedtime as needed for mild constipation.    neomycin-bacitracin-polymyxin (NEOSPORIN) [phone] ointment Apply 1 application topically daily as needed. Use for skin tears, abrasions or minor irritations    oxyCODONE (OXY IR/ROXICODONE) 5 MG immediate release tablet Take 1 tablet (5 mg total) by mouth every 4 (four) hours as needed for moderate pain. Qty: 30 tablet, Refills: 0      STOP taking these medications     aspirin 81 MG tablet        No Known Allergies    The results of significant diagnostics from this hospitalization (including imaging, microbiology, ancillary and laboratory) are listed below for reference.    Significant Diagnostic Studies: Ct Head Wo Contrast  10/28/2014   CLINICAL DATA:  Acute onset of altered mental status and generalized weakness. Initial encounter.  EXAM: CT HEAD WITHOUT CONTRAST  TECHNIQUE: Contiguous axial images were obtained from the base of the skull through the vertex without intravenous contrast.  COMPARISON:  None.  FINDINGS: There is no evidence of acute infarction, mass lesion, or intra- or extra-axial hemorrhage on CT.  Prominence of the ventricles and sulci reflects mild cortical volume loss. Scattered periventricular and subcortical white matter change likely reflects small vessel ischemic microangiopathy. Cerebellar atrophy is noted.  The brainstem and fourth ventricle are within normal limits. The  basal ganglia are unremarkable in appearance. The cerebral hemispheres demonstrate grossly normal gray-white differentiation. No mass effect or midline shift is seen.  There is no evidence of fracture; visualized osseous structures are unremarkable in appearance. The visualized portions of the orbits are within normal limits. Chronic mucoperiosteal thickening is noted at the right maxillary sinus. The remaining paranasal sinuses and mastoid air cells are well-aerated. No significant soft tissue abnormalities are seen.  IMPRESSION: 1. No acute intracranial pathology seen on CT. 2. Mild cortical volume loss and scattered small vessel ischemic microangiopathy. 3. Chronic mucoperiosteal thickening at the right maxillary sinus.   Electronically Signed   By: Roanna RaiderJeffery  Chang M.D.   On: 10/28/2014 01:03   Dg Chest Port 1 View  11/05/2014   CLINICAL DATA:  Rectal bleeding  EXAM: PORTABLE CHEST - 1 VIEW  COMPARISON:  10/28/2014  FINDINGS: Moderate cardiomegaly. Bibasilar airspace disease versus atelectasis is stable. No pneumothorax. No pleural effusion. Normal vascularity.  IMPRESSION: Cardiomegaly without decompensation.  Stable bibasilar atelectasis versus airspace disease.   Electronically Signed   By: Maryclare BeanArt  Hoss M.D.   On: 11/05/2014 09:06  Dg Chest Port 1 View  10/28/2014   CLINICAL DATA:  80 year old male with altered mental status and generalized weakness  EXAM: PORTABLE CHEST - 1 VIEW  COMPARISON:  CT scan of the head performed earlier today  FINDINGS: Mild cardiomegaly. Inspiratory volumes are low and there are bibasilar opacities. Normal mediastinal contours. No pneumothorax. No acute osseous abnormality.  IMPRESSION: 1. Low inspiratory volumes with bibasilar opacities favored to reflect atelectasis. Superimposed infiltrate would be difficult to exclude radiographically. 2. Mild cardiomegaly.   Electronically Signed   By: Malachy MoanHeath  McCullough M.D.   On: 10/28/2014 01:24    Microbiology: No results found for  this or any previous visit (from the past 240 hour(s)).   Labs: Basic Metabolic Panel:  Recent Labs Lab 11/05/14 0840 11/06/14 0719  NA 143 146  K 4.1 4.2  CL 110 114*  CO2 19 19  GLUCOSE 129* 133*  BUN 60* 40*  CREATININE 2.80* 2.29*  CALCIUM 9.3 9.0   Liver Function Tests:  Recent Labs Lab 11/05/14 0840  AST 12  ALT 14  ALKPHOS 92  BILITOT 0.3  PROT 8.3  ALBUMIN 3.2*   No results for input(s): LIPASE, AMYLASE in the last 168 hours. No results for input(s): AMMONIA in the last 168 hours. CBC:  Recent Labs Lab 11/05/14 0840 11/06/14 0719 11/07/14 0559  WBC 6.5 6.0 6.4  NEUTROABS 5.0  --   --   HGB 10.8* 10.1* 10.3*  HCT 33.6* 31.1* 32.4*  MCV 89.4 88.9 88.5  PLT 260 230 277   Cardiac Enzymes: No results for input(s): CKTOTAL, CKMB, CKMBINDEX, TROPONINI in the last 168 hours. BNP: BNP (last 3 results) No results for input(s): PROBNP in the last 8760 hours. CBG: No results for input(s): GLUCAP in the last 168 hours.     SignedGwenyth Bender:  Ulyssa Walthour M  [facility] 11/07/2014, 10:11 AM

## 2014-11-07 NOTE — Clinical Social Work Note (Signed)
Pt d/c today back [facility]. Amy at facility and pt's son, Paris LoreWiley are aware and agreeable. Facility to provide transport. D/C summary and FL2 faxed.   Derenda FennelKara Cayce Paschal, Kentucky[place]CSW [phone]

## 2015-08-02 DEATH — deceased

## 2016-05-19 IMAGING — CT CT HEAD W/O CM
1 of 2 series · 13 of 30 positions shown, 17 images · non-contrast
Comparison: None.

CLINICAL DATA: Acute onset of altered mental status and generalized
weakness. Initial encounter.

EXAM:
CT HEAD WITHOUT CONTRAST
TECHNIQUE: Contiguous axial images were obtained from the base of the skull
through the vertex without intravenous contrast.

[Series 2: headseq 4.8 h37s · axial · 0.46mm/px · z∈[+159,+306]mm · 13 of 36 slices shown, 17 images]
[im 3/36  brain]
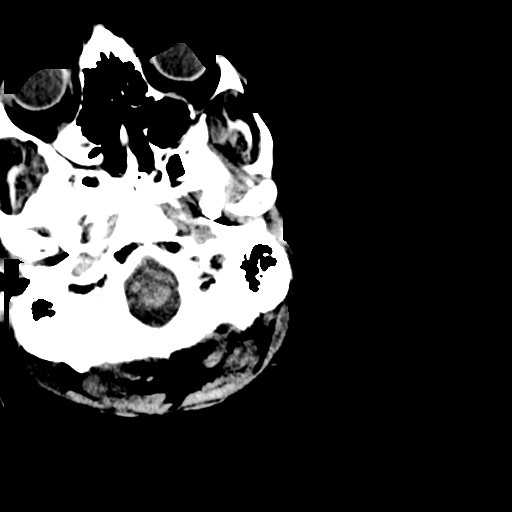
[im 3/36  bone]
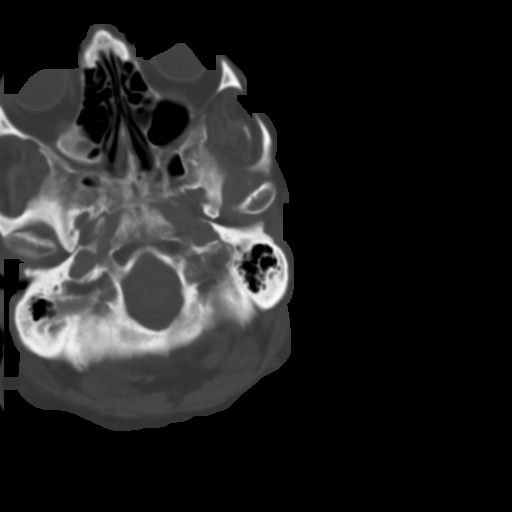
[im 6/36  brain]
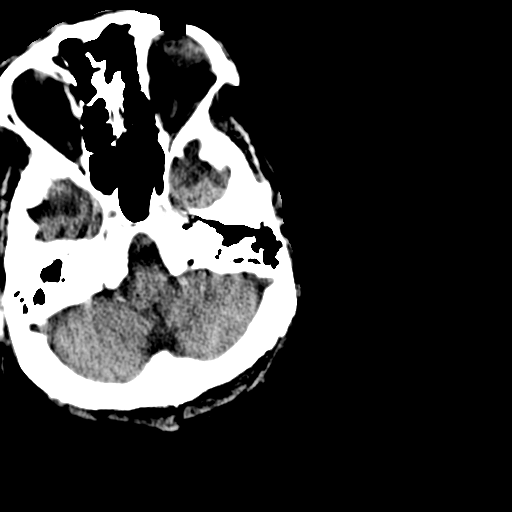
[im 8/36  brain]
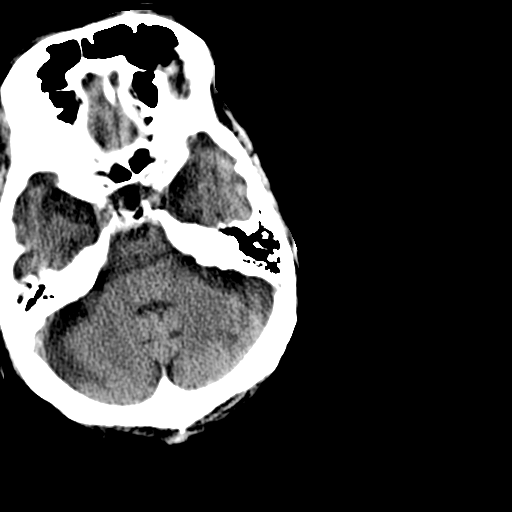
[im 11/36  brain]
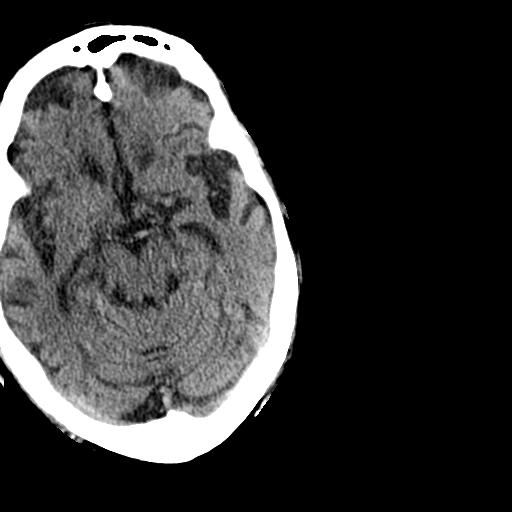
[im 13/36  brain]
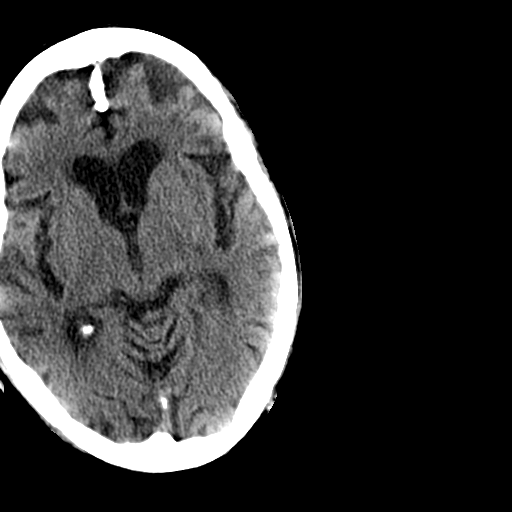
[im 13/36  bone]
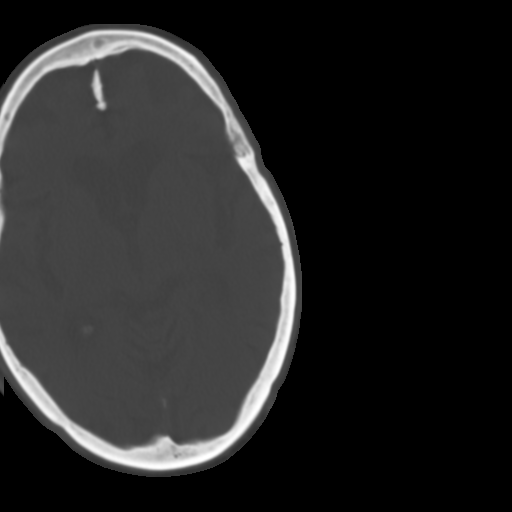
[im 16/36  brain]
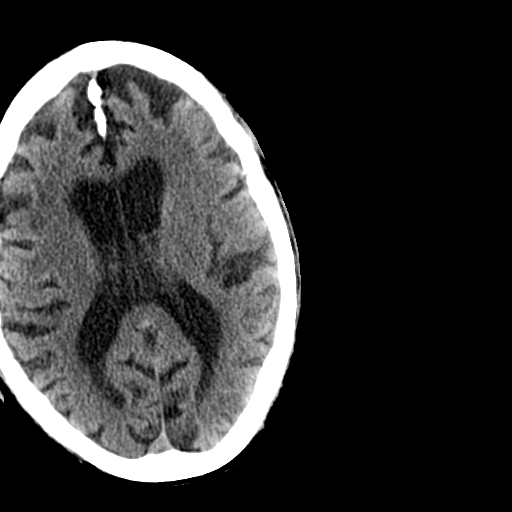
[im 18/36  brain]
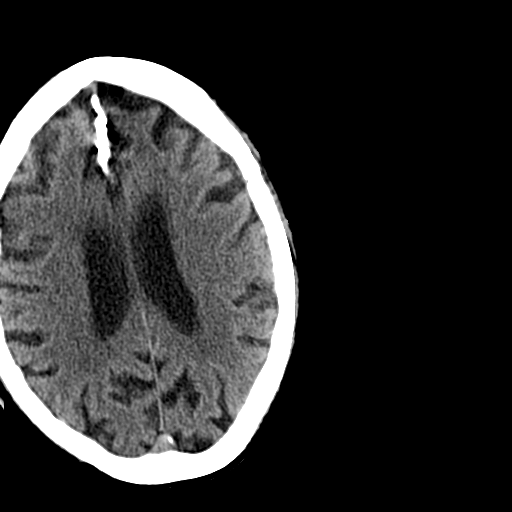
[im 21/36  brain]
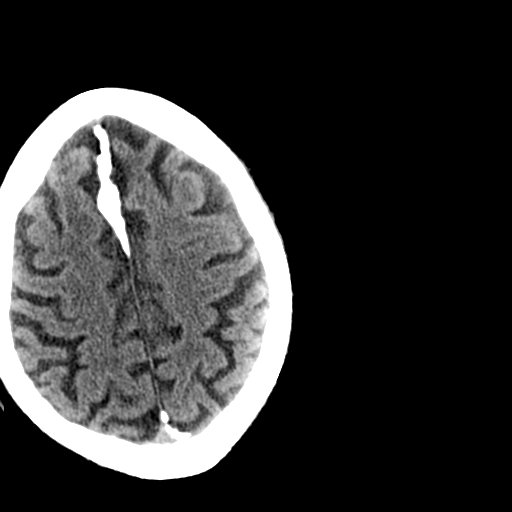
[im 23/36  brain]
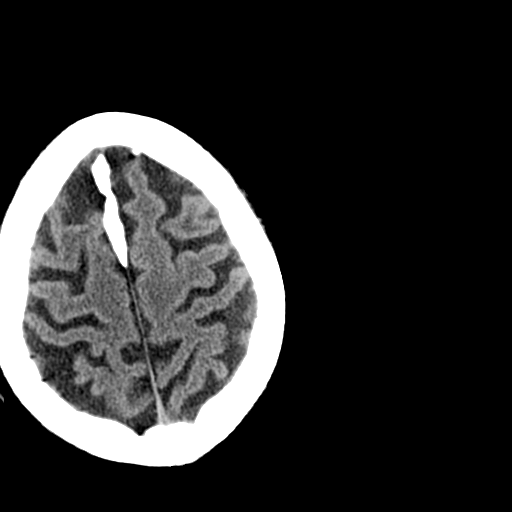
[im 23/36  bone]
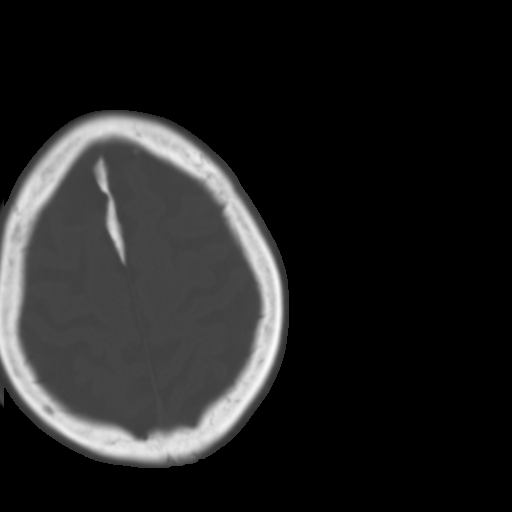
[im 26/36  brain]
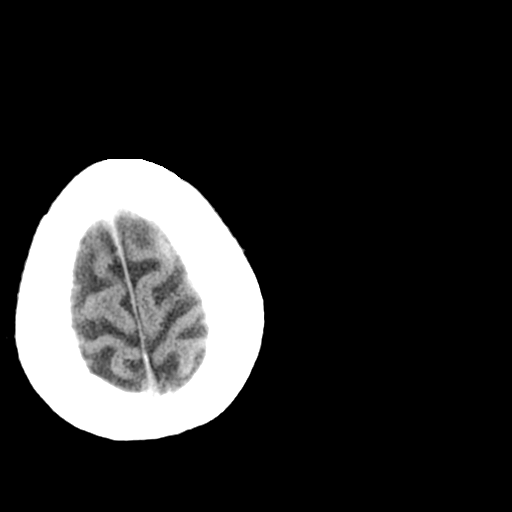
[im 28/36  brain]
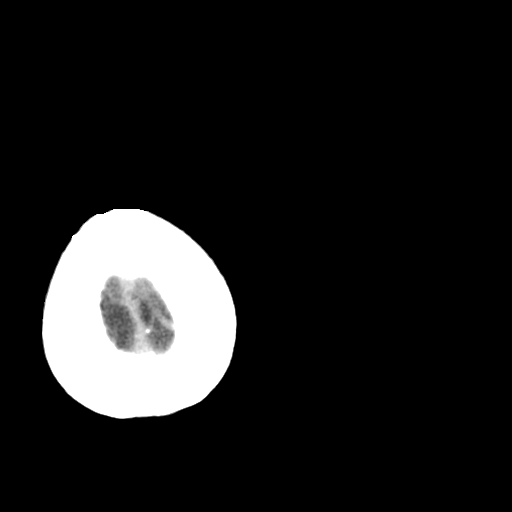
[im 31/36  brain]
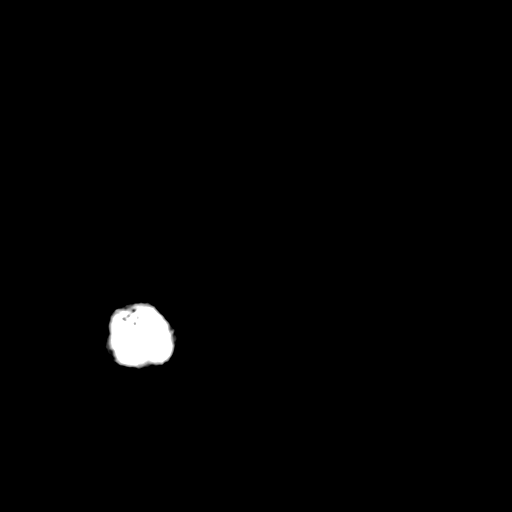
[im 33/36  brain]
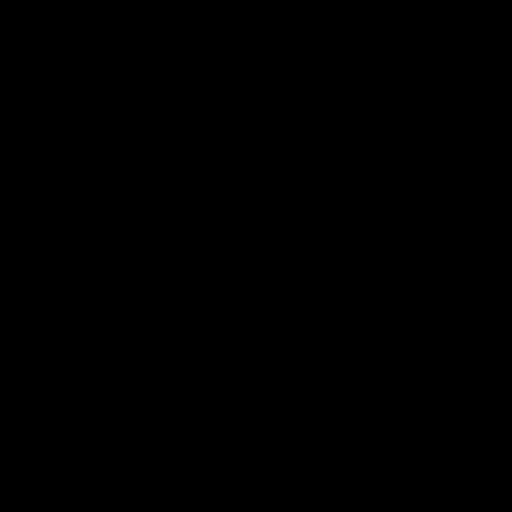
[im 33/36  bone]
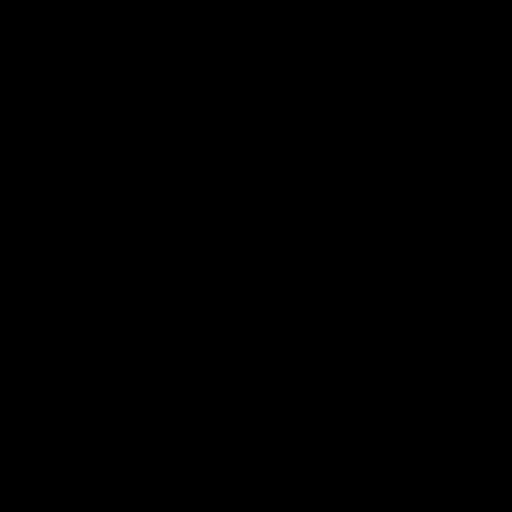

[13 of 30 positions shown; findings below may reference images not displayed]

FINDINGS: There is no evidence of acute infarction, mass lesion, or intra- or
extra-axial hemorrhage on CT.

Prominence of the ventricles and sulci reflects mild cortical volume
loss. Scattered periventricular and subcortical white matter change
likely reflects small vessel ischemic microangiopathy. Cerebellar
atrophy is noted.

The brainstem and fourth ventricle are within normal limits. The
basal ganglia are unremarkable in appearance. The cerebral
hemispheres demonstrate grossly normal gray-white differentiation.
No mass effect or midline shift is seen.

There is no evidence of fracture; visualized osseous structures are
unremarkable in appearance. The visualized portions of the orbits
are within normal limits. Chronic mucoperiosteal thickening is noted
at the right maxillary sinus. The remaining paranasal sinuses and
mastoid air cells are well-aerated. No significant soft tissue
abnormalities are seen.
IMPRESSION: 1. No acute intracranial pathology seen on CT.
2. Mild cortical volume loss and scattered small vessel ischemic
microangiopathy.
3. Chronic mucoperiosteal thickening at the right maxillary sinus.
